# Patient Record
Sex: Male | Born: 1970
Health system: Southern US, Community
[De-identification: ages and names within clinical notes are randomized; demographics above are authoritative.]

## PROBLEM LIST (undated history)

## (undated) DIAGNOSIS — K219 Gastro-esophageal reflux disease without esophagitis: Secondary | ICD-10-CM

## (undated) DIAGNOSIS — E785 Hyperlipidemia, unspecified: Secondary | ICD-10-CM

## (undated) DIAGNOSIS — N343 Urethral syndrome, unspecified: Secondary | ICD-10-CM

## (undated) DIAGNOSIS — N201 Calculus of ureter: Secondary | ICD-10-CM

## (undated) DIAGNOSIS — I10 Essential (primary) hypertension: Secondary | ICD-10-CM

## (undated) DIAGNOSIS — N200 Calculus of kidney: Secondary | ICD-10-CM

## (undated) DIAGNOSIS — G4700 Insomnia, unspecified: Secondary | ICD-10-CM

## (undated) DIAGNOSIS — I2510 Atherosclerotic heart disease of native coronary artery without angina pectoris: Secondary | ICD-10-CM

## (undated) HISTORY — PX: TONSILLECTOMY: SUR1361

## (undated) HISTORY — PX: HERNIA REPAIR: SHX51

## (undated) HISTORY — DX: Gastro-esophageal reflux disease without esophagitis: K21.9

## (undated) HISTORY — DX: Hyperlipidemia, unspecified: E78.5

---

## 1999-12-12 ENCOUNTER — Encounter: Payer: Self-pay | Admitting: Orthopedic Surgery

## 1999-12-12 ENCOUNTER — Inpatient Hospital Stay (HOSPITAL_COMMUNITY): Admission: EM | Admit: 1999-12-12 | Discharge: 1999-12-18 | Payer: Self-pay | Admitting: Emergency Medicine

## 1999-12-13 ENCOUNTER — Encounter: Payer: Self-pay | Admitting: Orthopedic Surgery

## 2000-03-28 ENCOUNTER — Emergency Department (HOSPITAL_COMMUNITY): Admission: EM | Admit: 2000-03-28 | Discharge: 2000-03-28 | Payer: Self-pay | Admitting: Emergency Medicine

## 2002-11-13 ENCOUNTER — Emergency Department (HOSPITAL_COMMUNITY): Admission: EM | Admit: 2002-11-13 | Discharge: 2002-11-13 | Payer: Self-pay

## 2006-07-25 ENCOUNTER — Emergency Department (HOSPITAL_COMMUNITY): Admission: EM | Admit: 2006-07-25 | Discharge: 2006-07-25 | Payer: Self-pay | Admitting: Emergency Medicine

## 2007-02-24 ENCOUNTER — Ambulatory Visit (HOSPITAL_BASED_OUTPATIENT_CLINIC_OR_DEPARTMENT_OTHER): Admission: RE | Admit: 2007-02-24 | Discharge: 2007-02-24 | Payer: Self-pay | Admitting: Surgery

## 2008-12-24 ENCOUNTER — Emergency Department (HOSPITAL_COMMUNITY): Admission: EM | Admit: 2008-12-24 | Discharge: 2008-12-24 | Payer: Self-pay | Admitting: Emergency Medicine

## 2010-07-09 NOTE — Op Note (Signed)
NAME:  Russell Gregory, BORN NO.:  [id number]   MEDICAL RECORD NO.:  [id number]          PATIENT TYPE:  AMB   LOCATION:  DSC                          FACILITY:  [facility]   PHYSICIAN:  Currie Paris, M.D.DATE OF BIRTH:  October 29, 1970   DATE OF PROCEDURE:  02/24/2007  DATE OF DISCHARGE:                               OPERATIVE REPORT   OFFICE MEDICAL RECORD NUMBER CCS [phone]   PREOPERATIVE DIAGNOSIS:  Left inguinal hernia.   POSTOPERATIVE DIAGNOSIS:  Left inguinal hernia - direct.   OPERATION:  Left inguinal hernia repair with mesh.   SURGEON:  Currie Paris, M.D.   ANESTHESIA:  General (LMA).   CLINICAL HISTORY:  Mr. Winokur is a 41 year old gentleman with a left  inguinal hernia that he wished to have repaired.  He decided to proceed.   DESCRIPTION OF PROCEDURE:  The patient was seen in the holding area and  we confirmed that a left inguinal hernia was the operative plan.  We  both initialed the left side as the operative side.   The patient was taken to the operating room.  After satisfactory general  anesthesia had been obtained the left lower quadrant was clipped,  prepped and draped.  The time-out was done.   I used 0.25% plain Marcaine to see if we could help with postop pain  relief.  I infiltrated the incision line and below the fascia at the  anterior superior iliac spine.   Incision was made and deepened to the external oblique aponeurosis with  bleeders either coagulated or tied.  The aponeurosis was opened in line  of its fibers into the superficial ring.  It was elevated off the  underlying tissue and the cord structures dissected up off the inguinal  floor.  He was found to have a direct hernia with the preperitoneal and  attenuated tissue stuck to the undersurface of the cord and this was  dissected off and the hernia reduced.  I used some 2-0 Prolenes to  reapproximate the transversalis to keep the bulge reduced while we  prepared to place the  mesh.   I cleaned off the cord structures to make sure there was no indirect sac  and removed a little bit of preperitoneal fat that was coming through,  but there was no indirect sac that could be identified.  I thought we  had the deep ring snugged up nicely with suturesplaced medially.  I then  put an onlay graft of Marlex mesh cut to shape and split to go around  the cord.  This was sutured in with a running 2-0 Prolene starting at  the pubic tubercle and running along the lateral edge until we got  beyond the deep ring.  It was then tacked down medially with several  sutures of 2-0 Prolene.  The tails were crossed and tacked together.  The ilioinguinal nerve had been identified and was kept with the cord  structures as they came through the mesh.   Everything appeared to be dry.  I put more local in as we worked.  The  incision was closed with  3-0 Vicryl to reapproximate the external  oblique and Scarpa's and 4-0 Monocryl subcuticular plus Dermabond on the  skin.   The patient tolerated the procedure well and there no complications.  All counts were correct.      Currie Paris, M.D.  Electronically Signed     CJS/MEDQ  D:  02/24/2007  T:  02/24/2007  Job:  [id number]   cc:   Ernestina Penna, M.D.
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
## 2010-10-30 ENCOUNTER — Ambulatory Visit
Admission: RE | Admit: 2010-10-30 | Discharge: 2010-10-30 | Disposition: A | Discharge status: Home / Self Care | Payer: Self-pay | Source: Ambulatory Visit | Attending: Sports Medicine | Admitting: Sports Medicine

## 2010-10-30 ENCOUNTER — Other Ambulatory Visit: Payer: Self-pay | Admitting: Sports Medicine

## 2010-10-30 DIAGNOSIS — M533 Sacrococcygeal disorders, not elsewhere classified: Secondary | ICD-10-CM

## 2010-11-29 LAB — BASIC METABOLIC PANEL
CO2: 28
Calcium: 9.6
GFR calc Af Amer: >60
GFR calc non Af Amer: >60
Glucose, Bld: 101 — ABNORMAL HIGH
Potassium: 4.2
Sodium: 138

## 2010-11-29 LAB — POCT HEMOGLOBIN-HEMACUE: Hemoglobin: 14.7

## 2011-03-23 ENCOUNTER — Encounter (HOSPITAL_BASED_OUTPATIENT_CLINIC_OR_DEPARTMENT_OTHER): Payer: Self-pay | Admitting: *Deleted

## 2011-03-23 DIAGNOSIS — N200 Calculus of kidney: Secondary | ICD-10-CM | POA: Insufficient documentation

## 2011-03-23 DIAGNOSIS — I10 Essential (primary) hypertension: Secondary | ICD-10-CM | POA: Insufficient documentation

## 2011-03-23 DIAGNOSIS — R112 Nausea with vomiting, unspecified: Secondary | ICD-10-CM | POA: Insufficient documentation

## 2011-03-23 DIAGNOSIS — M549 Dorsalgia, unspecified: Secondary | ICD-10-CM | POA: Insufficient documentation

## 2011-03-23 DIAGNOSIS — R109 Unspecified abdominal pain: Secondary | ICD-10-CM | POA: Insufficient documentation

## 2011-03-23 NOTE — ED Notes (Signed)
Pt with right upper abd pain that radiates right upper back as well as N/V with PO intake. Pt denies fever or diarrhea

## 2011-03-24 ENCOUNTER — Ambulatory Visit (HOSPITAL_BASED_OUTPATIENT_CLINIC_OR_DEPARTMENT_OTHER)
Admit: 2011-03-24 | Discharge: 2011-03-24 | Disposition: A | Discharge status: Home / Self Care | Payer: BC Managed Care – PPO | Attending: Emergency Medicine | Admitting: Emergency Medicine

## 2011-03-24 ENCOUNTER — Emergency Department (HOSPITAL_BASED_OUTPATIENT_CLINIC_OR_DEPARTMENT_OTHER)
Admission: EM | Admit: 2011-03-24 | Discharge: 2011-03-24 | Disposition: A | Discharge status: Home / Self Care | Payer: BC Managed Care – PPO | Attending: Emergency Medicine | Admitting: Emergency Medicine

## 2011-03-24 ENCOUNTER — Emergency Department (INDEPENDENT_AMBULATORY_CARE_PROVIDER_SITE_OTHER): Payer: BC Managed Care – PPO

## 2011-03-24 DIAGNOSIS — R1011 Right upper quadrant pain: Secondary | ICD-10-CM

## 2011-03-24 DIAGNOSIS — N200 Calculus of kidney: Secondary | ICD-10-CM

## 2011-03-24 DIAGNOSIS — R112 Nausea with vomiting, unspecified: Secondary | ICD-10-CM

## 2011-03-24 DIAGNOSIS — R109 Unspecified abdominal pain: Secondary | ICD-10-CM

## 2011-03-24 HISTORY — DX: Calculus of kidney: N20.0

## 2011-03-24 HISTORY — DX: Essential (primary) hypertension: I10

## 2011-03-24 LAB — CBC
HCT: 45.1 % (ref 39.0–52.0)
MCHC: 35.3 g/dL (ref 30.0–36.0)
Platelets: 185 10*3/uL (ref 150–400)
RDW: 12.6 % (ref 11.5–15.5)
WBC: 6.1 10*3/uL (ref 4.0–10.5)

## 2011-03-24 LAB — HEPATIC FUNCTION PANEL
ALT: 34 U/L (ref 0–53)
AST: 28 U/L (ref 0–37)
Albumin: 3.9 g/dL (ref 3.5–5.2)
Bilirubin, Direct: 0.1 mg/dL (ref 0.0–0.3)
Total Protein: 7.6 g/dL (ref 6.0–8.3)

## 2011-03-24 LAB — BASIC METABOLIC PANEL
BUN: 9 mg/dL (ref 6–23)
Chloride: 103 mEq/L (ref 96–112)
GFR calc Af Amer: >90 mL/min (ref >90)
GFR calc non Af Amer: >90 mL/min (ref >90)
Potassium: 3.8 mEq/L (ref 3.5–5.1)
Sodium: 136 mEq/L (ref 135–145)

## 2011-03-24 LAB — URINALYSIS, ROUTINE W REFLEX MICROSCOPIC
Glucose, UA: NEGATIVE mg/dL
Ketones, ur: NEGATIVE mg/dL
Leukocytes, UA: NEGATIVE
Nitrite: NEGATIVE
Protein, ur: NEGATIVE mg/dL

## 2011-03-24 MED ORDER — SODIUM CHLORIDE 0.9 % IV BOLUS (SEPSIS)
1000.0000 mL | Freq: Once | INTRAVENOUS | Status: AC
  Administered 2011-03-24: 1000 mL via INTRAVENOUS

## 2011-03-24 MED ORDER — ONDANSETRON HCL 4 MG/2ML IJ SOLN
4.0000 mg | Freq: Once | INTRAMUSCULAR | Status: AC
  Administered 2011-03-24: 4 mg via INTRAVENOUS
  Filled 2011-03-24: qty 2

## 2011-03-24 MED ORDER — ONDANSETRON HCL 4 MG PO TABS: 4.0000 mg | ORAL_TABLET | Freq: Four times a day (QID) | ORAL | Status: AC

## 2011-03-24 NOTE — ED Notes (Signed)
MD at bedside. 

## 2011-03-24 NOTE — ED Provider Notes (Addendum)
History   This chart was scribed for Sunnie Nielsen, MD by Melba Coon. The patient was seen in room MH11/MH11 and the patient's care was started at 12:08AM.    CSN: [id number]  Arrival date & time 03/23/11  2352   First MD Initiated Contact with Patient 03/24/11 0003      Chief Complaint  Patient presents with  . Abdominal Pain  . Back Pain    (Consider location/radiation/quality/duration/timing/severity/associated sxs/prior treatment) HPI Russell Gregory is a 41 y.o. male who presents to the Emergency Department complaining of constant moderate to severe abdomninal pain with an onset 2-3 days ago. Associated back pain with an onset 2 weeks ago. Location right side abdomen. Sharp in quality. Radiates to the back. Abd pain hurts so much that pt can't sleep. Pt has had prior episode of kidney stones and says that it feels similar. Vomit and some indigestion present. No dysuria or problems urinating. No other pertinent medical problems. No Hx of abd surgereis. Take BP meds and Nexium. Return of pain tonight while at work and had to leave due to symptoms. No history of same. 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 Hypertension   . Kidney calculi     Past Surgical History  Procedure Date  . Tonsillectomy     History reviewed. No pertinent family history.  History  Substance Use Topics  . Smoking status: Never Smoker   . Smokeless tobacco: Not on file  . Alcohol Use: No      Review of Systems  Constitutional: Negative for fever and chills.  HENT: Negative for neck pain and neck stiffness.   Eyes: Negative for pain.  Respiratory: Negative for shortness of breath.   Cardiovascular: Negative for chest pain, palpitations and leg swelling.  Gastrointestinal: Positive for nausea, vomiting and abdominal pain.  Genitourinary: Negative for dysuria.  Musculoskeletal: Negative for back pain.  Skin:  Negative for rash.  Neurological: Negative for headaches.  All other systems reviewed and are negative.   10 Systems reviewed and are negative for acute change except as noted in the HPI.  Allergies  Morphine and related  Home Medications   Current Outpatient Rx  Name Route Sig Dispense Refill  . ESOMEPRAZOLE MAGNESIUM 40 MG PO CPDR Oral Take 40 mg by mouth daily before breakfast.    . TRANDOLAPRIL-VERAPAMIL HCL ER 2-240 MG PO TBCR Oral Take 1 tablet by mouth daily.      BP 121/77  Pulse 76  Temp(Src) 97.9 F (36.6 C) (Oral)  Resp 18  Ht 5\' 11"  (1.803 m)  Wt 180 lb (81.647 kg)  BMI 25.10 kg/m2  SpO2 100%  Physical Exam  Nursing note and vitals reviewed. Constitutional: He is oriented to person, place, and time. He appears well-developed and well-nourished.  HENT:  Head: Normocephalic and atraumatic.  Right Ear: External ear normal.  Left Ear: External ear normal.  Eyes: Conjunctivae and EOM are normal. Pupils are equal, round, and reactive to light. No scleral icterus.  Neck: Normal range of motion. Neck supple. No thyromegaly present.  Cardiovascular: Normal rate, regular rhythm and normal heart sounds.  Exam reveals no gallop and no friction rub.   No murmur heard. Pulmonary/Chest: Effort normal and breath sounds normal. No stridor. He has no wheezes. He has no rales. He exhibits no tenderness.  Abdominal: Soft. Bowel sounds are normal. He exhibits no distension and no mass.  There is no tenderness (right flank pain; no reproducible tednerness; standing up during exam due to discomfort). There is no rebound.  Musculoskeletal: Normal range of motion. He exhibits no edema. Tenderness: No CVA tenderness.  Lymphadenopathy:    He has no cervical adenopathy.  Neurological: He is alert and oriented to person, place, and time. Coordination normal.  Skin: Skin is warm. No rash noted. No erythema.  Psychiatric: He has a normal mood and affect. His behavior is normal.    ED  Course  Procedures (including critical care time)  DIAGNOSTIC STUDIES: Oxygen Saturation is 100% on room air, normal by my interpretation.    COORDINATION OF CARE: 12:12 AM - Pt decline any pain meds  Results for orders placed during the hospital encounter of 03/24/11  URINALYSIS, ROUTINE W REFLEX MICROSCOPIC      Component Value Range   Color, Urine YELLOW  YELLOW    APPearance CLEAR  CLEAR    Specific Gravity, Urine 1.025  1.005 - 1.030    pH 7.0  5.0 - 8.0    Glucose, UA NEGATIVE  NEGATIVE (mg/dL)   Hgb urine dipstick NEGATIVE  NEGATIVE    Bilirubin Urine NEGATIVE  NEGATIVE    Ketones, ur NEGATIVE  NEGATIVE (mg/dL)   Protein, ur NEGATIVE  NEGATIVE (mg/dL)   Urobilinogen, UA 0.2  0.0 - 1.0 (mg/dL)   Nitrite NEGATIVE  NEGATIVE    Leukocytes, UA NEGATIVE  NEGATIVE   CBC      Component Value Range   WBC 6.1  4.0 - 10.5 (K/uL)   RBC 4.94  4.22 - 5.81 (MIL/uL)   Hemoglobin 15.9  13.0 - 17.0 (g/dL)   HCT 96.0  45.4 - 09.8 (%)   MCV 91.3  78.0 - 100.0 (fL)   MCH 32.2  26.0 - 34.0 (pg)   MCHC 35.3  30.0 - 36.0 (g/dL)   RDW 11.9  14.7 - 82.9 (%)   Platelets 185  150 - 400 (K/uL)  BASIC METABOLIC PANEL      Component Value Range   Sodium 136  135 - 145 (mEq/L)   Potassium 3.8  3.5 - 5.1 (mEq/L)   Chloride 103  96 - 112 (mEq/L)   CO2 24  19 - 32 (mEq/L)   Glucose, Bld 132 (*) 70 - 99 (mg/dL)   BUN 9  6 - 23 (mg/dL)   Creatinine, Ser 5.62  0.50 - 1.35 (mg/dL)   Calcium 9.0  8.4 - 13.0 (mg/dL)   GFR calc non Af Amer >90  >90 (mL/min)   GFR calc Af Amer >90  >90 (mL/min)  HEPATIC FUNCTION PANEL      Component Value Range   Total Protein 7.6  6.0 - 8.3 (g/dL)   Albumin 3.9  3.5 - 5.2 (g/dL)   AST 28  0 - 37 (U/L)   ALT 34  0 - 53 (U/L)   Alkaline Phosphatase 79  39 - 117 (U/L)   Total Bilirubin 0.3  0.3 - 1.2 (mg/dL)   Bilirubin, Direct 0.1  0.0 - 0.3 (mg/dL)   Indirect Bilirubin 0.2 (*) 0.3 - 0.9 (mg/dL)   Ct Abdomen Pelvis Wo Contrast  03/24/2011  *RADIOLOGY REPORT*   Clinical Data: Right flank pain, nausea, and vomiting  CT ABDOMEN AND PELVIS WITHOUT CONTRAST  Technique:  Multidetector CT imaging of the abdomen and pelvis was performed following the standard protocol without intravenous contrast.  Comparison: None available.  Findings: The lung bases are clear.  3 mm stone in  the lower pole of the right kidney without obstruction.  Punctate stone in the lower pole of the left kidney without obstruction.  No pyelocaliectasis or ureterectasis.  No ureteral stones or bladder stones.  The unenhanced appearance of the liver, spleen, gallbladder, pancreas, adrenal glands, abdominal aorta, and retroperitoneal lymph nodes is unremarkable.  The stomach and small bowel are decompressed.  Stool filled colon without wall thickening or distension.  No free air or free fluid in the abdomen.  Pelvis:  The bladder is decompressed.  The prostate is not significantly enlarged.  No free or loculated pelvic fluid collections.  The appendix is normal.  No inflammatory changes in the sigmoid colon.  Normal alignment of the lumbar vertebrae.  IMPRESSION: Bilateral nonobstructive intrarenal stones.  No ureteral stone or obstruction.  Original Report Authenticated By: Marlon Pel, M.D.    Serial rechecks. No peritonitis or reproducible tenderness. No vomiting in ER. UA, labs and CT scan reviewed as above. No obvious ureterolithiasis. Plan ultrasound. No ultrasound tech available tonight so will schedule for first thing in the morning. Based on improving condition, do not feel patient needs to be transferred to another facility tonight for emergent ultrasound.  He is agreeable to this plan.    MDM  Right-sided abdominal pain. Possible ureteral lithiasis versus gallbladder disease. No findings of kidney stone on CAT scan and no hematuria. No leukocytosis or fever. Pain improved and patient declines any pain medications in the ER.   Zofran and IV fluids provided  Plan: Return in a.m. for  ultrasound. If no gallbladder disease is found, will refer to GI as needed for any persistent symptoms. Reliable historian and agrees to be evaluated immediately for any fevers, severe pain or worsening condition.   I personally performed the services described in this documentation, which was scribed in my presence. The recorded information has been reviewed and considered.        Sunnie Nielsen, MD 03/24/11 [id number]  Sunnie Nielsen, MD 03/24/11 [phone]

## 2011-03-25 ENCOUNTER — Encounter: Payer: Self-pay | Admitting: Gastroenterology

## 2011-04-03 ENCOUNTER — Ambulatory Visit: Payer: BC Managed Care – PPO | Admitting: Gastroenterology

## 2012-05-19 ENCOUNTER — Other Ambulatory Visit: Payer: Self-pay | Admitting: *Deleted

## 2012-05-20 ENCOUNTER — Telehealth: Payer: Self-pay | Admitting: Family Medicine

## 2012-05-20 MED ORDER — ZOLPIDEM TARTRATE 10 MG PO TABS: 10.0000 mg | ORAL_TABLET | Freq: Every evening | ORAL | Status: DC | PRN

## 2012-05-20 NOTE — Telephone Encounter (Signed)
Patient called to get a refill on his Ambien 10mg  once daily.

## 2012-05-21 NOTE — Telephone Encounter (Signed)
Called into [facility]

## 2012-06-17 ENCOUNTER — Other Ambulatory Visit: Payer: Self-pay | Admitting: Family Medicine

## 2012-06-17 NOTE — Telephone Encounter (Signed)
Pt has an appt. With you on 07/23/12 for med check up, last seen by [facility] on 02/23/13. Last filled 05/21/12 by [facility]. If approved have nurse call into [facility]

## 2012-06-21 ENCOUNTER — Telehealth: Payer: Self-pay | Admitting: Nurse Practitioner

## 2012-06-21 NOTE — Telephone Encounter (Signed)
Was called in by Earna Coder at 12:02 PM today

## 2012-06-21 NOTE — Telephone Encounter (Signed)
Phoned into [facility] pharmacy and left on their voicemail.

## 2012-06-24 ENCOUNTER — Telehealth: Payer: Self-pay

## 2012-06-24 NOTE — Telephone Encounter (Signed)
Left message to return call with refills needed appt Jul 23 2012

## 2012-07-09 ENCOUNTER — Other Ambulatory Visit: Payer: Self-pay | Admitting: *Deleted

## 2012-07-09 MED ORDER — ATORVASTATIN CALCIUM 40 MG PO TABS: 40.0000 mg | ORAL_TABLET | Freq: Every day | ORAL | Status: DC

## 2012-07-23 ENCOUNTER — Encounter: Payer: Self-pay | Admitting: Family Medicine

## 2012-07-23 ENCOUNTER — Ambulatory Visit (INDEPENDENT_AMBULATORY_CARE_PROVIDER_SITE_OTHER): Payer: BC Managed Care – PPO | Admitting: Family Medicine

## 2012-07-23 VITALS — BP 134/85 | HR 55 | Temp 98.1°F | Ht 71.0 in | Wt 187.6 lb

## 2012-07-23 DIAGNOSIS — K219 Gastro-esophageal reflux disease without esophagitis: Secondary | ICD-10-CM | POA: Insufficient documentation

## 2012-07-23 DIAGNOSIS — G4700 Insomnia, unspecified: Secondary | ICD-10-CM | POA: Insufficient documentation

## 2012-07-23 DIAGNOSIS — E785 Hyperlipidemia, unspecified: Secondary | ICD-10-CM

## 2012-07-23 DIAGNOSIS — I10 Essential (primary) hypertension: Secondary | ICD-10-CM

## 2012-07-23 HISTORY — DX: Hyperlipidemia, unspecified: E78.5

## 2012-07-23 LAB — COMPLETE METABOLIC PANEL WITH GFR
ALT: 25 U/L (ref 0–53)
AST: 21 U/L (ref 0–37)
Albumin: 4.9 g/dL (ref 3.5–5.2)
Alkaline Phosphatase: 66 U/L (ref 39–117)
BUN: 15 mg/dL (ref 6–23)
CO2: 26 mEq/L (ref 19–32)
Calcium: 9.5 mg/dL (ref 8.4–10.5)
Chloride: 102 mEq/L (ref 96–112)
Creat: 1.03 mg/dL (ref 0.50–1.35)
GFR, Est African American: >89 mL/min
GFR, Est Non African American: 89 mL/min
Glucose, Bld: 86 mg/dL (ref 70–99)
Potassium: 4.1 mEq/L (ref 3.5–5.3)
Sodium: 138 mEq/L (ref 135–145)
Total Bilirubin: 0.7 mg/dL (ref 0.3–1.2)
Total Protein: 7.3 g/dL (ref 6.0–8.3)

## 2012-07-23 MED ORDER — ATORVASTATIN CALCIUM 40 MG PO TABS: 40.0000 mg | ORAL_TABLET | Freq: Every day | ORAL | Status: DC

## 2012-07-23 MED ORDER — ESOMEPRAZOLE MAGNESIUM 40 MG PO CPDR: 40.0000 mg | DELAYED_RELEASE_CAPSULE | Freq: Every day | ORAL | Status: DC

## 2012-07-23 MED ORDER — ZOLPIDEM TARTRATE 10 MG PO TABS: ORAL_TABLET | ORAL | Status: DC

## 2012-07-23 NOTE — Progress Notes (Signed)
Patient ID: Russell Gregory, male   DOB: Feb 01, 1971, 42 y.o.   MRN: [id number] SUBJECTIVE: HPI: Patient is here for follow up of hypertension:  denies Headache;deniesChest Pain;denies weakness;denies Shortness of Breath or Orthopnea;denies Visual changes;denies palpitations;denies cough;denies pedal edema;denies symptoms of TIA or stroke; admits to Compliance with medications. denies Problems with medications.  INsomnia: Works  Swing shifts for years and for the last 7 years has used [place] to regulate sleep.  Patient is here for follow up of hyperlipidemia:  denies Headache;denies Chest Pain;denies weakness;denies Shortness of Breath and orthopnea;denies Visual changes;denies palpitations;denies cough;denies pedal edema;denies symptoms of TIA or stroke;deniesClaudication symptoms. admits to Compliance with medications; denies Problems with medications.   PMH/PSH: reviewed/updated in Epic  SH/FH: reviewed/updated in Epic, wife works for [employer] and is trying to [employer] healthy  Allergies: reviewed/updated in [employer]  Medications: reviewed/updated in [employer]  Immunizations: reviewed/updated in Epic  ROS: As above in the HPI. All other systems are stable or negative.  OBJECTIVE: APPEARANCE:  Patient in no acute distress.The patient appeared well nourished and normally developed. Acyanotic. Waist: VITAL SIGNS:BP 134/85  Pulse 55  Temp(Src) 98.1 F (36.7 C) (Oral)  Ht 5\' 11"  (1.803 m)  Wt 187 lb 9.6 oz (85.095 kg)  BMI 26.18 kg/m2   SKIN: warm and  Dry without overt rashes, tattoos and scars  HEAD and Neck: without JVD, Head and scalp: normal Eyes:No scleral icterus. Fundi normal, eye movements normal. Ears: Auricle normal, canal normal, Tympanic membranes normal, insufflation normal. Nose: normal Throat: normal Neck & thyroid: normal  CHEST & LUNGS: Chest wall: normal Lungs: Clear  CVS: Reveals the PMI to be normally located. Regular rhythm, First and Second  Heart sounds are normal,  absence of murmurs, rubs or gallops. Peripheral vasculature: Radial pulses: normal Dorsal pedis pulses: normal Posterior pulses: normal  ABDOMEN:  Appearance: normal Benign,, no organomegaly, no masses, no Abdominal Aortic enlargement. No Guarding , no rebound. No Bruits. Bowel sounds: normal  RECTAL: N/A GU: N/A  EXTREMETIES: nonedematous. Both Femoral and Pedal pulses are normal.  MUSCULOSKELETAL:  Spine: normal Joints: intact  NEUROLOGIC: oriented to time,place and person; nonfocal. Strength is normal Sensory is normal Reflexes are normal Cranial Nerves are normal.  ASSESSMENT: HTN (hypertension) - Plan: COMPLETE METABOLIC PANEL WITH GFR  HLD (hyperlipidemia) - Plan: atorvastatin (LIPITOR) 40 MG tablet, COMPLETE METABOLIC PANEL WITH GFR, NMR Lipoprofile with Lipids  Insomnia - Plan: zolpidem (AMBIEN) 10 MG tablet  GERD (gastroesophageal reflux disease) - Plan: esomeprazole (NEXIUM) 40 MG capsule  PLAN: Orders Placed This Encounter  Procedures  . COMPLETE METABOLIC PANEL WITH GFR  . NMR Lipoprofile with Lipids   Meds ordered this encounter  Medications  . esomeprazole (NEXIUM) 40 MG capsule    Sig: Take 1 capsule (40 mg total) by mouth daily before breakfast.    Dispense:  30 capsule    Refill:  0  . zolpidem (AMBIEN) 10 MG tablet    Sig: TAKE 1 TABLET DAILY AT BEDTIME AS NEEDED    Dispense:  30 tablet    Refill:  1  . atorvastatin (LIPITOR) 40 MG tablet    Sig: Take 1 tablet (40 mg total) by mouth daily.    Dispense:  90 tablet    Refill:  2        Dr Woodroe Mode Recommendations  Diet and Exercise discussed with patient.  For nutrition information, I recommend books:  1).Eat to Live by Dr Monico Hoar. 2).Prevent and Reverse Heart Disease by Dr  Suzzette Righter. 3) Dr Katherina Right Book: Reversing Diabetes  Exercise recommendations are:  If unable to walk, then the patient can exercise in a chair 3 times a day.  By flapping arms like a bird gently and raising legs outwards to the front.  If ambulatory, the patient can go for walks for 30 minutes 3 times a week. Then increase the intensity and duration as tolerated.  Goal is to try to attain exercise frequency to 5 times a week.  If applicable: Best to perform resistance exercises (machines or weights) 2 days a week and cardio type exercises 3 days per week.  Return in about 3 months (around 10/23/2012) for Recheck medical problems.  Hasan Douse P. Modesto Charon, M.D.

## 2012-07-23 NOTE — Patient Instructions (Addendum)
      Dr Shalita Notte's Recommendations  Diet and Exercise discussed with patient.  For nutrition information, I recommend books:  1).Eat to Live by Dr Joel Fuhrman. 2).Prevent and Reverse Heart Disease by Dr Caldwell Esselstyn. 3) Dr Neal Barnard's Book: Reversing Diabetes  Exercise recommendations are:  If unable to walk, then the patient can exercise in a chair 3 times a day. By flapping arms like a bird gently and raising legs outwards to the front.  If ambulatory, the patient can go for walks for 30 minutes 3 times a week. Then increase the intensity and duration as tolerated.  Goal is to try to attain exercise frequency to 5 times a week.  If applicable: Best to perform resistance exercises (machines or weights) 2 days a week and cardio type exercises 3 days per week.  

## 2012-07-27 LAB — NMR LIPOPROFILE WITH LIPIDS
Cholesterol, Total: 151 mg/dL (ref <200)
HDL Particle Number: 33.7 umol/L (ref >=30.5)
HDL Size: 8.5 nm — ABNORMAL LOW (ref >=9.2)
HDL-C: 40 mg/dL (ref >=40)
LDL (calc): 86 mg/dL (ref <100)
LDL Particle Number: 1365 nmol/L — ABNORMAL HIGH (ref <1000)
LDL Size: 20.1 nm — ABNORMAL LOW (ref >20.5)
LP-IR Score: 70 — ABNORMAL HIGH (ref <=45)
Large HDL-P: 1.4 umol/L — ABNORMAL LOW (ref >=4.8)
Large VLDL-P: 2.3 nmol/L (ref <=2.7)
Small LDL Particle Number: 940 nmol/L — ABNORMAL HIGH (ref <=527)
Triglycerides: 126 mg/dL (ref <150)
VLDL Size: 51.4 nm — ABNORMAL HIGH (ref <=46.6)

## 2012-07-28 NOTE — Progress Notes (Signed)
Quick Note:  Lab result close to goal. No change in Medications for now. Aggressive dietary changes. Read the book "Eat to Live" by Dr Ottis Stain. No Change in plans and follow up. ______

## 2012-08-02 ENCOUNTER — Telehealth: Payer: Self-pay | Admitting: Family Medicine

## 2012-08-03 ENCOUNTER — Other Ambulatory Visit: Payer: Self-pay | Admitting: Nurse Practitioner

## 2012-09-13 ENCOUNTER — Other Ambulatory Visit: Payer: Self-pay | Admitting: Family Medicine

## 2012-09-15 NOTE — Telephone Encounter (Signed)
Med called

## 2012-09-15 NOTE — Telephone Encounter (Signed)
LAST RF 08/17/12. LAST OV 07/23/12. IF APPROVED CALL [facility].

## 2012-09-15 NOTE — Telephone Encounter (Signed)
Call in please

## 2012-10-12 ENCOUNTER — Other Ambulatory Visit: Payer: Self-pay | Admitting: Family Medicine

## 2012-10-13 NOTE — Telephone Encounter (Signed)
Patient last office visit on 07-23-12. Please advise. If approved please route to Pool A so nurse can call in to pharmacy

## 2012-10-14 ENCOUNTER — Encounter: Payer: Self-pay | Admitting: *Deleted

## 2012-10-14 NOTE — Telephone Encounter (Signed)
Rx ready for Phone in. 

## 2012-10-15 NOTE — Telephone Encounter (Signed)
rx for zolpidem called to [facility]

## 2012-10-27 ENCOUNTER — Ambulatory Visit: Payer: BC Managed Care – PPO | Admitting: Family Medicine

## 2012-10-29 ENCOUNTER — Encounter: Payer: Self-pay | Admitting: Family Medicine

## 2012-10-29 ENCOUNTER — Ambulatory Visit (INDEPENDENT_AMBULATORY_CARE_PROVIDER_SITE_OTHER): Payer: BC Managed Care – PPO | Admitting: Family Medicine

## 2012-10-29 VITALS — BP 135/84 | HR 49 | Temp 97.6°F | Ht 72.0 in | Wt 187.8 lb

## 2012-10-29 DIAGNOSIS — K219 Gastro-esophageal reflux disease without esophagitis: Secondary | ICD-10-CM

## 2012-10-29 DIAGNOSIS — N200 Calculus of kidney: Secondary | ICD-10-CM | POA: Insufficient documentation

## 2012-10-29 DIAGNOSIS — E785 Hyperlipidemia, unspecified: Secondary | ICD-10-CM

## 2012-10-29 DIAGNOSIS — Z23 Encounter for immunization: Secondary | ICD-10-CM

## 2012-10-29 DIAGNOSIS — I10 Essential (primary) hypertension: Secondary | ICD-10-CM

## 2012-10-29 DIAGNOSIS — G4700 Insomnia, unspecified: Secondary | ICD-10-CM

## 2012-10-29 MED ORDER — ZOLPIDEM TARTRATE 10 MG PO TABS: ORAL_TABLET | ORAL | Status: DC

## 2012-10-29 NOTE — Patient Instructions (Addendum)

## 2012-10-29 NOTE — Progress Notes (Signed)
Tolerated tdap injection without difficulty 

## 2012-10-29 NOTE — Progress Notes (Signed)
Patient ID: Russell Gregory, male   DOB: 06-May-1970, 42 y.o.   MRN: [id number] SUBJECTIVE: CC: Chief Complaint  Patient presents with  . Follow-up    3 month follow up no complaints    HPI: Patient is here for follow up of hyperlipidemia: denies Headache;denies Chest Pain;denies weakness;denies Shortness of Breath and orthopnea;denies Visual changes;denies palpitations;denies cough;denies pedal edema;denies symptoms of TIA or stroke;deniesClaudication symptoms. admits to Compliance with medications; denies Problems with medications.  Changed his diet a bit. His wife is trying to cook from the recipes in "Eat To Live"  Works  Swing shift.has problem with insomnia.  Past Medical History  Diagnosis Date  . Inguinal hernia   . Hyperlipidemia   . Hypertension   . GERD (gastroesophageal reflux disease)    Past Surgical History  Procedure Laterality Date  . Tonsillectomy    . Hernia repair      left inguinal repair   History   Social History  . Marital Status: Married    Spouse Name: N/A    Number of Children: N/A  . Years of Education: N/A   Occupational History  . Not on file.   Social History Main Topics  . Smoking status: Never Smoker   . Smokeless tobacco: Not on file  . Alcohol Use: No  . Drug Use: No  . Sexual Activity:    Other Topics Concern  . Not on file   Social History Narrative  . No narrative on file   No family history on file. Current Outpatient Prescriptions on File Prior to Visit  Medication Sig Dispense Refill  . atorvastatin (LIPITOR) 40 MG tablet Take 1 tablet (40 mg total) by mouth daily.  90 tablet  2  . esomeprazole (NEXIUM) 40 MG capsule Take 1 capsule (40 mg total) by mouth daily before breakfast.  30 capsule  0  . TARKA 4-240 MG per tablet TAKE 1 TABLET EVERY DAY  90 tablet  1  . zolpidem (AMBIEN) 10 MG tablet TAKE 1 TABLET AT BEDTIME  30 tablet  0   No current facility-administered medications on file prior to visit.   Allergies   Allergen Reactions  . Morphine And Related    Immunization History  Administered Date(s) Administered  . Tdap 10/29/2012   Prior to Admission medications   Medication Sig Start Date End Date Taking? Authorizing Provider  atorvastatin (LIPITOR) 40 MG tablet Take 1 tablet (40 mg total) by mouth daily. 07/23/12  Yes Ileana Ladd, MD  esomeprazole (NEXIUM) 40 MG capsule Take 1 capsule (40 mg total) by mouth daily before breakfast. 07/23/12  Yes Ileana Ladd, MD  TARKA 4-240 MG per tablet TAKE 1 TABLET EVERY DAY 08/03/12  Yes Ileana Ladd, MD  zolpidem (AMBIEN) 10 MG tablet TAKE 1 TABLET AT BEDTIME 10/12/12  Yes Ileana Ladd, MD     ROS: As above in the HPI. All other systems are stable or negative.  OBJECTIVE: APPEARANCE:  Patient in no acute distress.The patient appeared well nourished and normally developed. Acyanotic. Waist: VITAL SIGNS:BP 135/84  Pulse 49  Temp(Src) 97.6 F (36.4 C) (Oral)  Ht 6' (1.829 m)  Wt 187 lb 12.8 oz (85.186 kg)  BMI 25.46 kg/m2 WM  SKIN: warm and  Dry without overt rashes, tattoos and scars  HEAD and Neck: without JVD, Head and scalp: normal Eyes:No scleral icterus. Fundi normal, eye movements normal. Ears: Auricle normal, canal normal, Tympanic membranes normal, insufflation normal. Nose: normal Throat: normal Neck &  thyroid: normal  CHEST & LUNGS: Chest wall: normal Lungs: Clear  CVS: Reveals the PMI to be normally located. Regular rhythm, First and Second Heart sounds are normal,  absence of murmurs, rubs or gallops. Peripheral vasculature: Radial pulses: normal Dorsal pedis pulses: normal Posterior pulses: normal  ABDOMEN:  Appearance: normal Benign, no organomegaly, no masses, no Abdominal Aortic enlargement. No Guarding , no rebound. No Bruits. Bowel sounds: normal  RECTAL: N/A GU: N/A  EXTREMETIES: nonedematous.  MUSCULOSKELETAL:  Spine: normal Joints: intact  NEUROLOGIC: oriented to time,place and person;  nonfocal. Strength is normal Sensory is normal Reflexes are normal Cranial Nerves are normal.  Results for orders placed in visit on 07/23/12  COMPLETE METABOLIC PANEL WITH GFR      Result Value Range   Sodium 138  135 - 145 mEq/L   Potassium 4.1  3.5 - 5.3 mEq/L   Chloride 102  96 - 112 mEq/L   CO2 26  19 - 32 mEq/L   Glucose, Bld 86  70 - 99 mg/dL   BUN 15  6 - 23 mg/dL   Creat 4.40  3.47 - 4.25 mg/dL   Total Bilirubin 0.7  0.3 - 1.2 mg/dL   Alkaline Phosphatase 66  39 - 117 U/L   AST 21  0 - 37 U/L   ALT 25  0 - 53 U/L   Total Protein 7.3  6.0 - 8.3 g/dL   Albumin 4.9  3.5 - 5.2 g/dL   Calcium 9.5  8.4 - 95.6 mg/dL   GFR, Est African American >89     GFR, Est Non African American 89    NMR LIPOPROFILE WITH LIPIDS      Result Value Range   LDL Particle Number [id number] (*) <1000 nmol/L   LDL (calc) 86  <100 mg/dL   HDL-C 40  >=38 mg/dL   Triglycerides 756  <433 mg/dL   Cholesterol, Total 295  <200 mg/dL   HDL Particle Number 18.8  >=41.6 umol/L   Large HDL-P 1.4 (*) >=4.8 umol/L   Large VLDL-P 2.3  <=2.7 nmol/L   Small LDL Particle Number [id number] (*) <=527 nmol/L   LDL Size 20.1 (*) >20.5 nm   HDL Size 8.5 (*) >=9.2 nm   VLDL Size 51.4 (*) <=46.6 nm   LP-IR Score 70 (*) <=45    ASSESSMENT: Need for Tdap vaccination - Plan: Tdap vaccine greater than or equal to 7yo IM  GERD (gastroesophageal reflux disease)  HLD (hyperlipidemia) - Plan: CMP14+EGFR, NMR, lipoprofile  HTN (hypertension) - Plan: CMP14+EGFR  Insomnia - Plan: zolpidem (AMBIEN) 10 MG tablet  PLAN:   Orders Placed This Encounter  Procedures  . Tdap vaccine greater than or equal to 7yo IM  . CMP14+EGFR  . NMR, lipoprofile    Meds ordered this encounter  Medications  . zolpidem (AMBIEN) 10 MG tablet    Sig: TAKE 1 TABLET AT BEDTIME    Dispense:  30 tablet    Refill:  1    Discuss a healthy plant based diet and lifestyle.  Return in about 4 months (around 02/28/2013) for Recheck medical  problems.  Laurissa Cowper P. [place] Charon, M.D.

## 2012-10-31 LAB — NMR, LIPOPROFILE
Cholesterol: 169 mg/dL (ref <200)
HDL Cholesterol by NMR: 44 mg/dL (ref >=40)
HDL Particle Number: 36.4 umol/L (ref >=30.5)
LDL Particle Number: 1658 nmol/L — ABNORMAL HIGH (ref <1000)
LDL Size: 20.6 nm (ref >20.5)
LDLC SERPL CALC-MCNC: 102 mg/dL — ABNORMAL HIGH (ref <100)
LP-IR Score: 58 — ABNORMAL HIGH (ref <=45)
Small LDL Particle Number: 1020 nmol/L — ABNORMAL HIGH (ref <=527)
Triglycerides by NMR: 114 mg/dL (ref <150)

## 2012-10-31 LAB — CMP14+EGFR
ALT: 25 IU/L (ref 0–44)
AST: 23 IU/L (ref 0–40)
Albumin/Globulin Ratio: 1.8 (ref 1.1–2.5)
Albumin: 4.6 g/dL (ref 3.5–5.5)
Alkaline Phosphatase: 65 IU/L (ref 39–117)
BUN/Creatinine Ratio: 17 (ref 9–20)
BUN: 18 mg/dL (ref 6–24)
CO2: 21 mmol/L (ref 18–29)
Calcium: 9.7 mg/dL (ref 8.7–10.2)
Chloride: 98 mmol/L (ref 97–108)
Creatinine, Ser: 1.03 mg/dL (ref 0.76–1.27)
GFR calc Af Amer: 103 mL/min/{1.73_m2} (ref >59)
GFR calc non Af Amer: 89 mL/min/{1.73_m2} (ref >59)
Globulin, Total: 2.6 g/dL (ref 1.5–4.5)
Glucose: 83 mg/dL (ref 65–99)
Potassium: 4.2 mmol/L (ref 3.5–5.2)
Sodium: 138 mmol/L (ref 134–144)
Total Bilirubin: 0.5 mg/dL (ref 0.0–1.2)
Total Protein: 7.2 g/dL (ref 6.0–8.5)

## 2012-11-03 ENCOUNTER — Other Ambulatory Visit: Payer: Self-pay | Admitting: Family Medicine

## 2012-11-03 MED ORDER — EZETIMIBE 10 MG PO TABS: 10.0000 mg | ORAL_TABLET | Freq: Every day | ORAL | Status: DC

## 2013-01-12 ENCOUNTER — Other Ambulatory Visit: Payer: Self-pay | Admitting: Family Medicine

## 2013-01-12 NOTE — Telephone Encounter (Signed)
If approved route [facility] Last filled 12/15/12.

## 2013-01-12 NOTE — Telephone Encounter (Signed)
[facility]

## 2013-01-12 NOTE — Telephone Encounter (Signed)
This is okay as written

## 2013-02-01 ENCOUNTER — Other Ambulatory Visit: Payer: Self-pay | Admitting: Family Medicine

## 2013-03-01 ENCOUNTER — Encounter: Payer: Self-pay | Admitting: Family Medicine

## 2013-03-01 ENCOUNTER — Ambulatory Visit (INDEPENDENT_AMBULATORY_CARE_PROVIDER_SITE_OTHER): Payer: BC Managed Care – PPO | Admitting: Family Medicine

## 2013-03-01 VITALS — BP 125/82 | HR 55 | Temp 97.4°F | Ht 72.0 in | Wt 188.6 lb

## 2013-03-01 DIAGNOSIS — E785 Hyperlipidemia, unspecified: Secondary | ICD-10-CM

## 2013-03-01 DIAGNOSIS — G4700 Insomnia, unspecified: Secondary | ICD-10-CM

## 2013-03-01 DIAGNOSIS — I10 Essential (primary) hypertension: Secondary | ICD-10-CM

## 2013-03-01 DIAGNOSIS — K219 Gastro-esophageal reflux disease without esophagitis: Secondary | ICD-10-CM

## 2013-03-01 MED ORDER — ZOLPIDEM TARTRATE 10 MG PO TABS: ORAL_TABLET | ORAL | Status: DC

## 2013-03-01 MED ORDER — EZETIMIBE-SIMVASTATIN 10-20 MG PO TABS: 1.0000 | ORAL_TABLET | Freq: Every day | ORAL | Status: DC

## 2013-03-01 MED ORDER — TRANDOLAPRIL-VERAPAMIL HCL ER 4-240 MG PO TBCR: EXTENDED_RELEASE_TABLET | ORAL | Status: DC

## 2013-03-01 NOTE — Patient Instructions (Signed)
      Dr Surah Pelley's Recommendations  For nutrition information, I recommend books:  1).Eat to Live by Dr Joel Fuhrman. 2).Prevent and Reverse Heart Disease by Dr Caldwell Esselstyn. 3) Dr Neal Barnard's Book:  Program to Reverse Diabetes  Exercise recommendations are:  If unable to walk, then the patient can exercise in a chair 3 times a day. By flapping arms like a bird gently and raising legs outwards to the front.  If ambulatory, the patient can go for walks for 30 minutes 3 times a week. Then increase the intensity and duration as tolerated.  Goal is to try to attain exercise frequency to 5 times a week.  If applicable: Best to perform resistance exercises (machines or weights) 2 days a week and cardio type exercises 3 days per week.  

## 2013-03-01 NOTE — Progress Notes (Signed)
Patient ID: Russell Gregory, male   DOB: [birth year], 43 y.o.   MRN: [id number] SUBJECTIVE: CC: Chief Complaint  Patient presents with  . Follow-up    4 month legs continining to ache wants different med  refill ambien and tarka    HPI: Patient is here for follow up of hyperlipidemia/HTN: denies Headache;denies Chest Pain;denies weakness;denies Shortness of Breath and orthopnea;denies Visual changes;denies palpitations;denies cough;denies pedal edema;denies symptoms of TIA or stroke;deniesClaudication symptoms. admits to Compliance with medications; Problems with medications.legs hurt intermittently and he attributes it to the atorvastatin and worse with the zetia  Added. He would like to try the vytorin.his wife uses without problems.   Past Medical History  Diagnosis Date  . Inguinal hernia   . Hyperlipidemia   . Hypertension   . GERD (gastroesophageal reflux disease)    Past Surgical History  Procedure Laterality Date  . Tonsillectomy    . Hernia repair      left inguinal repair   History   Social History  . Marital Status: Married    Spouse Name: N/A    Number of Children: N/A  . Years of Education: N/A   Occupational History  . Not on file.   Social History Main Topics  . Smoking status: Never Smoker   . Smokeless tobacco: Not on file  . Alcohol Use: No  . Drug Use: No  . Sexual Activity:    Other Topics Concern  . Not on file   Social History Narrative  . No narrative on file   No family history on file. Current Outpatient Prescriptions on File Prior to Visit  Medication Sig Dispense Refill  . esomeprazole (NEXIUM) 40 MG capsule Take 1 capsule (40 mg total) by mouth daily before breakfast.  30 capsule  0   No current facility-administered medications on file prior to visit.   Allergies  Allergen Reactions  . Morphine And Related    Immunization History  Administered Date(s) Administered  . Influenza-Unspecified 12/30/2012  . Tdap 10/29/2012    Prior to Admission medications   Medication Sig Start Date End Date Taking? Authorizing Provider  atorvastatin (LIPITOR) 40 MG tablet Take 1 tablet (40 mg total) by mouth daily. 07/23/12  Yes Vernie Shanks, MD  ezetimibe (ZETIA) 10 MG tablet Take 1 tablet (10 mg total) by mouth daily. 11/03/12  Yes Vernie Shanks, MD  TARKA 4-240 MG per tablet TAKE 1 TABLET EVERY DAY 02/01/13  Yes Vernie Shanks, MD  zolpidem (AMBIEN) 10 MG tablet TAKE 1 TABLET AT BEDTIME 01/12/13  Yes Chipper Herb, MD  esomeprazole (NEXIUM) 40 MG capsule Take 1 capsule (40 mg total) by mouth daily before breakfast. 07/23/12   Vernie Shanks, MD     ROS: As above in the HPI. All other systems are stable or negative.  OBJECTIVE: APPEARANCE:  Patient in no acute distress.The patient appeared well nourished and normally developed. Acyanotic. Waist: VITAL SIGNS:BP 125/82  Pulse 55  Temp(Src) 97.4 F (36.3 C) (Oral)  Ht 6' (1.829 m)  Wt 188 lb 9.6 oz (85.548 kg)  BMI 25.57 kg/m2 WM  SKIN: warm and  Dry without overt rashes, tattoos and scars  HEAD and Neck: without JVD, Head and scalp: normal Eyes:No scleral icterus. Fundi normal, eye movements normal. Ears: Auricle normal, canal normal, Tympanic membranes normal, insufflation normal. Nose: normal Throat: normal Neck & thyroid: normal  CHEST & LUNGS: Chest wall: normal Lungs: Clear  CVS: Reveals the PMI to be normally located. Regular  rhythm, First and Second Heart sounds are normal,  absence of murmurs, rubs or gallops. Peripheral vasculature: Radial pulses: normal Dorsal pedis pulses: normal Posterior pulses: normal  ABDOMEN:  Appearance: normal Benign, no organomegaly, no masses, no Abdominal Aortic enlargement. No Guarding , no rebound. No Bruits. Bowel sounds: normal  RECTAL: N/A GU: N/A  EXTREMETIES: nonedematous.  MUSCULOSKELETAL:  Spine: normal Joints: intact  NEUROLOGIC: oriented to time,place and person; nonfocal. Strength is  normal Sensory is normal Reflexes are normal Cranial Nerves are normal.  Results for orders placed in visit on 10/29/12  CMP14+EGFR      Result Value Range   Glucose 83  65 - 99 mg/dL   BUN 18  6 - 24 mg/dL   Creatinine, Ser 1.03  0.76 - 1.27 mg/dL   GFR calc non Af Amer 89  >59 mL/min/1.73   GFR calc Af Amer 103  >59 mL/min/1.73   BUN/Creatinine Ratio 17  9 - 20   Sodium 138  134 - 144 mmol/L   Potassium 4.2  3.5 - 5.2 mmol/L   Chloride 98  97 - 108 mmol/L   CO2 21  18 - 29 mmol/L   Calcium 9.7  8.7 - 10.2 mg/dL   Total Protein 7.2  6.0 - 8.5 g/dL   Albumin 4.6  3.5 - 5.5 g/dL   Globulin, Total 2.6  1.5 - 4.5 g/dL   Albumin/Globulin Ratio 1.8  1.1 - 2.5   Total Bilirubin 0.5  0.0 - 1.2 mg/dL   Alkaline Phosphatase 65  39 - 117 IU/L   AST 23  0 - 40 IU/L   ALT 25  0 - 44 IU/L  NMR, LIPOPROFILE      Result Value Range   LDL Particle Number [id number] (*) <1000 nmol/L   LDLC SERPL CALC-MCNC 102 (*) <100 mg/dL   HDL Cholesterol by NMR 44  >=40 mg/dL   Triglycerides by NMR 114  <150 mg/dL   Cholesterol 169  <200 mg/dL   HDL Particle Number 36.4  >=30.5 umol/L   Small LDL Particle Number 1020 (*) <=527 nmol/L   LDL Size 20.6  >20.5 nm   LP-IR Score 58 (*) <=45    ASSESSMENT: Insomnia - Plan: zolpidem (AMBIEN) 10 MG tablet  GERD (gastroesophageal reflux disease)  HTN (hypertension) - Plan: trandolapril-verapamil (TARKA) 4-240 MG per tablet, CMP14+EGFR  HLD (hyperlipidemia) - Plan: ezetimibe-simvastatin (VYTORIN) 10-20 MG per tablet, CMP14+EGFR, Lipid panel  PLAN:      Dr Paula Libra Recommendations  For nutrition information, I recommend books:  1).Eat to Live by Dr Excell Seltzer. 2).Prevent and Reverse Heart Disease by Dr Karl Luke. 3) Dr Janene Harvey Book:  Program to Reverse Diabetes  Exercise recommendations are:  If unable to walk, then the patient can exercise in a chair 3 times a day. By flapping arms like a bird gently and raising legs outwards to  the front.  If ambulatory, the patient can go for walks for 30 minutes 3 times a week. Then increase the intensity and duration as tolerated.  Goal is to try to attain exercise frequency to 5 times a week.  If applicable: Best to perform resistance exercises (machines or weights) 2 days a week and cardio type exercises 3 days per week.  Orders Placed This Encounter  Procedures  . CMP14+EGFR    Standing Status: Future     Number of Occurrences:      Standing Expiration Date: 03/01/2014    Order Specific Question:  Has  the patient fasted?    Answer:  Yes  . Lipid panel    Standing Status: Future     Number of Occurrences:      Standing Expiration Date: 03/01/2014    Order Specific Question:  Has the patient fasted?    Answer:  Yes   Meds ordered this encounter  Medications  . trandolapril-verapamil (TARKA) 4-240 MG per tablet    Sig: TAKE 1 TABLET EVERY DAY    Dispense:  90 tablet    Refill:  3  . zolpidem (AMBIEN) 10 MG tablet    Sig: TAKE 1 TABLET AT BEDTIME    Dispense:  30 tablet    Refill:  2  . ezetimibe-simvastatin (VYTORIN) 10-20 MG per tablet    Sig: Take 1 tablet by mouth at bedtime.    Dispense:  90 tablet    Refill:  3  samples for vytorin 10/20 given for 4 weeks.  Medications Discontinued During This Encounter  Medication Reason  . ezetimibe (ZETIA) 10 MG tablet Side effect (s)  . atorvastatin (LIPITOR) 40 MG tablet Side effect (s)  . TARKA 4-240 MG per tablet Reorder  . zolpidem (AMBIEN) 10 MG tablet Reorder   Return in about 4 months (around 06/29/2013), or 6 weeks lab appointment, for Recheck medical problems.  Jaquanda Wickersham P. Jacelyn Grip, M.D.

## 2013-03-04 ENCOUNTER — Telehealth: Payer: Self-pay | Admitting: *Deleted

## 2013-03-04 NOTE — Telephone Encounter (Signed)
Pharmacy sent fax stating Vytorin 10-20 and Tarka ER 4-240mg  may interact based on the potential interaction between lovastatin(>20mg ) Simvastatin (>10mg ) and verapamil. Please advise and contact pharmacy

## 2013-03-06 NOTE — Telephone Encounter (Signed)
Have patient stop the vytorin and make an appointment with Tammy or Marcelino DusterMichelle to review and adjust medications especially in view of his risks with drug interaction.

## 2013-03-07 NOTE — Telephone Encounter (Signed)
PAtient aware he has an appointment with Dr.wong he just wants to go over it with him

## 2013-04-12 ENCOUNTER — Other Ambulatory Visit: Payer: BC Managed Care – PPO

## 2013-06-01 ENCOUNTER — Other Ambulatory Visit: Payer: Self-pay | Admitting: Family Medicine

## 2013-06-02 NOTE — Telephone Encounter (Signed)
Patient last seen in office on 03-01-13. Rx last filled on 05-04-13. Please advise. If approved please route to Pool A so nurse can phone in to pharmacy

## 2013-06-03 NOTE — Telephone Encounter (Signed)
Patient needs to be seen. Has exceeded time since last visit. Limited quantity refilled. Needs to bring all medications to next appointment.   

## 2013-06-04 ENCOUNTER — Other Ambulatory Visit: Payer: Self-pay | Admitting: Family Medicine

## 2013-06-06 NOTE — Telephone Encounter (Signed)
Approved on 06/01/13 with no refills.  [facility] detailed [facility].

## 2013-06-06 NOTE — Telephone Encounter (Signed)
Rx was filled on 06/01/2013 Refill denied. Bring all medications at next office visit.

## 2013-06-30 ENCOUNTER — Ambulatory Visit (INDEPENDENT_AMBULATORY_CARE_PROVIDER_SITE_OTHER): Payer: BC Managed Care – PPO | Admitting: Family Medicine

## 2013-06-30 ENCOUNTER — Encounter: Payer: Self-pay | Admitting: Family Medicine

## 2013-06-30 VITALS — BP 130/81 | HR 63 | Temp 97.3°F | Ht 72.0 in | Wt 187.4 lb

## 2013-06-30 DIAGNOSIS — I10 Essential (primary) hypertension: Secondary | ICD-10-CM

## 2013-06-30 DIAGNOSIS — G4700 Insomnia, unspecified: Secondary | ICD-10-CM

## 2013-06-30 DIAGNOSIS — K219 Gastro-esophageal reflux disease without esophagitis: Secondary | ICD-10-CM

## 2013-06-30 DIAGNOSIS — E785 Hyperlipidemia, unspecified: Secondary | ICD-10-CM

## 2013-06-30 DIAGNOSIS — N200 Calculus of kidney: Secondary | ICD-10-CM

## 2013-06-30 MED ORDER — ZOLPIDEM TARTRATE 10 MG PO TABS: ORAL_TABLET | ORAL | Status: AC

## 2013-06-30 NOTE — Progress Notes (Signed)
Patient ID: Russell Gregory, male   DOB: 10-05-1970, 43 y.o.   MRN: [id number] SUBJECTIVE: CC: Chief Complaint  Patient presents with  . Hypertension    4 month recheck  . Hyperlipidemia    HPI: Patient is here for follow up of hyperlipidemia/HTN: denies Headache;denies Chest Pain;denies weakness;denies Shortness of Breath and orthopnea;denies Visual changes;denies palpitations;denies cough;denies pedal edema;denies symptoms of TIA or stroke;deniesClaudication symptoms. admits to Compliance with medications; denies Problems with medications.    Past Medical History  Diagnosis Date  . Inguinal hernia   . Hyperlipidemia   . Hypertension   . GERD (gastroesophageal reflux disease)    Past Surgical History  Procedure Laterality Date  . Tonsillectomy    . Hernia repair      left inguinal repair   History   Social History  . Marital Status: Married    Spouse Name: N/A    Number of Children: N/A  . Years of Education: N/A   Occupational History  . Not on file.   Social History Main Topics  . Smoking status: Never Smoker   . Smokeless tobacco: Not on file  . Alcohol Use: No  . Drug Use: No  . Sexual Activity:    Other Topics Concern  . Not on file   Social History Narrative  . No narrative on file   No family history on file. Current Outpatient Prescriptions on File Prior to Visit  Medication Sig Dispense Refill  . trandolapril-verapamil (TARKA) 4-240 MG per tablet TAKE 1 TABLET EVERY DAY  90 tablet  3   No current facility-administered medications on file prior to visit.   Allergies  Allergen Reactions  . Morphine And Related    Immunization History  Administered Date(s) Administered  . Influenza-Unspecified 12/30/2012  . Tdap 10/29/2012   Prior to Admission medications   Medication Sig Start Date End Date Taking? Authorizing Provider  trandolapril-verapamil Preston Fleeting) 4-240 MG per tablet TAKE 1 TABLET EVERY DAY 03/01/13  Yes Vernie Shanks, MD  zolpidem  (AMBIEN) 10 MG tablet TAKE 1 TABLET BY MOUTH AT BEDTIME   Yes Vernie Shanks, MD  etodolac (LODINE) 400 MG tablet Take 400 mg by mouth daily. 06/10/13   Historical Provider, MD     ROS: As above in the HPI. All other systems are stable or negative.  OBJECTIVE: APPEARANCE:  Patient in no acute distress.The patient appeared well nourished and normally developed. Acyanotic. Waist: VITAL SIGNS:BP 130/81  Pulse 63  Temp(Src) 97.3 F (36.3 C) (Oral)  Ht 6' (1.829 m)  Wt 187 lb 6.4 oz (85.004 kg)  BMI 25.41 kg/m2 WM Worked night shift  SKIN: warm and  Dry without overt rashes, tattoos and scars  HEAD and Neck: without JVD, Head and scalp: normal Eyes:No scleral icterus. Fundi normal, eye movements normal. Ears: Auricle normal, canal normal, Tympanic membranes normal, insufflation normal. Nose: normal Throat: normal Neck & thyroid: normal  CHEST & LUNGS: Chest wall: normal Lungs: Clear  CVS: Reveals the PMI to be normally located. Regular rhythm, First and Second Heart sounds are normal,  absence of murmurs, rubs or gallops. Peripheral vasculature: Radial pulses: normal Dorsal pedis pulses: normal Posterior pulses: normal  ABDOMEN:  Appearance: normal Benign, no organomegaly, no masses, no Abdominal Aortic enlargement. No Guarding , no rebound. No Bruits. Bowel sounds: normal  RECTAL: N/A GU: N/A  EXTREMETIES: nonedematous.  MUSCULOSKELETAL:  Spine: normal Joints: intact  NEUROLOGIC: oriented to time,place and person; nonfocal. Strength is normal Sensory is normal Reflexes are  normal Cranial Nerves are normal. Results for orders placed in visit on 10/29/12  CMP14+EGFR      Result Value Ref Range   Glucose 83  65 - 99 mg/dL   BUN 18  6 - 24 mg/dL   Creatinine, Ser 1.03  0.76 - 1.27 mg/dL   GFR calc non Af Amer 89  >59 mL/min/1.73   GFR calc Af Amer 103  >59 mL/min/1.73   BUN/Creatinine Ratio 17  9 - 20   Sodium 138  134 - 144 mmol/L   Potassium 4.2   3.5 - 5.2 mmol/L   Chloride 98  97 - 108 mmol/L   CO2 21  18 - 29 mmol/L   Calcium 9.7  8.7 - 10.2 mg/dL   Total Protein 7.2  6.0 - 8.5 g/dL   Albumin 4.6  3.5 - 5.5 g/dL   Globulin, Total 2.6  1.5 - 4.5 g/dL   Albumin/Globulin Ratio 1.8  1.1 - 2.5   Total Bilirubin 0.5  0.0 - 1.2 mg/dL   Alkaline Phosphatase 65  39 - 117 IU/L   AST 23  0 - 40 IU/L   ALT 25  0 - 44 IU/L  NMR, LIPOPROFILE      Result Value Ref Range   LDL Particle Number [id number] (*) <1000 nmol/L   LDLC SERPL CALC-MCNC 102 (*) <100 mg/dL   HDL Cholesterol by NMR 44  >=40 mg/dL   Triglycerides by NMR 114  <150 mg/dL   Cholesterol 169  <200 mg/dL   HDL Particle Number 36.4  >=30.5 umol/L   Small LDL Particle Number [id number] (*) <=527 nmol/L   LDL Size 20.6  >20.5 nm   LP-IR Score 58 (*) <=45    ASSESSMENT: HTN (hypertension) - Plan: CMP14+EGFR  HLD (hyperlipidemia) - Plan: Lipid panel  GERD (gastroesophageal reflux disease)  Kidney calculi  Insomnia - Plan: zolpidem (AMBIEN) 10 MG tablet  PLAN: Dash diet in the AVS       Dr Paula Libra Recommendations  For nutrition information, I recommend books:  1).Eat to Live by Dr Excell Seltzer. 2).Prevent and Reverse Heart Disease by Dr Karl Luke. 3) Dr Janene Harvey Book:  Program to Reverse Diabetes  Exercise recommendations are:  If unable to walk, then the patient can exercise in a chair 3 times a day. By flapping arms like a bird gently and raising legs outwards to the front.  If ambulatory, the patient can go for walks for 30 minutes 3 times a week. Then increase the intensity and duration as tolerated.  Goal is to try to attain exercise frequency to 5 times a week.  If applicable: Best to perform resistance exercises (machines or weights) 2 days a week and cardio type exercises 3 days per week.   Orders Placed This Encounter  Procedures  . CMP14+EGFR  . Lipid panel   Meds ordered this encounter  Medications  . etodolac (LODINE) 400 MG  tablet    Sig: Take 400 mg by mouth daily.  . Omega-3 Fatty Acids (FISH OIL) 1000 MG CAPS    Sig: Take 2,000 mg by mouth.  . Soybean Lecithin (LECITHIN SOYA) POWD    Sig: 2 scoop by Does not apply route.  [name] zolpidem (AMBIEN) 10 MG tablet    Sig: TAKE 1 TABLET BY MOUTH AT BEDTIME    Dispense:  30 tablet    Refill:  3   Medications Discontinued During This Encounter  Medication Reason  . ezetimibe-simvastatin (VYTORIN) 10-20 MG per  tablet Prescription never filled  . esomeprazole (NEXIUM) 40 MG capsule Patient Preference  . zolpidem (AMBIEN) 10 MG tablet Reorder   Return in about 4 months (around 10/31/2013) for Recheck medical problems.  Laelynn Blizzard P. Jacelyn Grip, M.D.

## 2013-06-30 NOTE — Patient Instructions (Signed)
DASH Diet The DASH diet stands for "Dietary Approaches to Stop Hypertension." It is a healthy eating plan that has been shown to reduce high blood pressure (hypertension) in as little as 14 days, while also possibly providing other significant health benefits. These other health benefits include reducing the risk of breast cancer after menopause and reducing the risk of type 2 diabetes, heart disease, colon cancer, and stroke. Health benefits also include weight loss and slowing kidney failure in patients with chronic kidney disease.  DIET GUIDELINES  Limit salt (sodium). Your diet should contain less than 1500 mg of sodium daily.  Limit refined or processed carbohydrates. Your diet should include mostly whole grains. Desserts and added sugars should be used sparingly.  Include small amounts of heart-healthy fats. These types of fats include nuts, oils, and tub margarine. Limit saturated and trans fats. These fats have been shown to be harmful in the body. CHOOSING FOODS  The following food groups are based on a 2000 calorie diet. See your Registered Dietitian for individual calorie needs. Grains and Grain Products (6 to 8 servings daily)  Eat More Often: Whole-wheat bread, brown rice, whole-grain or wheat pasta, quinoa, popcorn without added fat or salt (air popped).  Eat Less Often: White bread, white pasta, white rice, cornbread. Vegetables (4 to 5 servings daily)  Eat More Often: Fresh, frozen, and canned vegetables. Vegetables may be raw, steamed, roasted, or grilled with a minimal amount of fat.  Eat Less Often/Avoid: Creamed or fried vegetables. Vegetables in a cheese sauce. Fruit (4 to 5 servings daily)  Eat More Often: All fresh, canned (in natural juice), or frozen fruits. Dried fruits without added sugar. One hundred percent fruit juice ( cup [237 mL] daily).  Eat Less Often: Dried fruits with added sugar. Canned fruit in light or heavy syrup. Lean Meats, Fish, and Poultry (2  servings or less daily. One serving is 3 to 4 oz [85-114 g]).  Eat More Often: Ninety percent or leaner ground beef, tenderloin, sirloin. Round cuts of beef, chicken breast, turkey breast. All fish. Grill, bake, or broil your meat. Nothing should be fried.  Eat Less Often/Avoid: Fatty cuts of meat, turkey, or chicken leg, thigh, or wing. Fried cuts of meat or fish. Dairy (2 to 3 servings)  Eat More Often: Low-fat or fat-free milk, low-fat plain or light yogurt, reduced-fat or part-skim cheese.  Eat Less Often/Avoid: Milk (whole, 2%).Whole milk yogurt. Full-fat cheeses. Nuts, Seeds, and Legumes (4 to 5 servings per week)  Eat More Often: All without added salt.  Eat Less Often/Avoid: Salted nuts and seeds, canned beans with added salt. Fats and Sweets (limited)  Eat More Often: Vegetable oils, tub margarines without trans fats, sugar-free gelatin. Mayonnaise and salad dressings.  Eat Less Often/Avoid: Coconut oils, palm oils, butter, stick margarine, cream, half and half, cookies, candy, pie. FOR MORE INFORMATION The Dash Diet Eating Plan: www.dashdiet.org Document Released: 01/30/2011 Document Revised: 05/05/2011 Document Reviewed: 01/30/2011 [employer] Patient Information 2014 [employer].        Dr Kenneshia Rehm's Recommendations  For nutrition information, I recommend books:  1).Eat to Live by Dr Joel Fuhrman. 2).Prevent and Reverse Heart Disease by Dr Caldwell Esselstyn. 3) Dr Neal Barnard's Book:  Program to Reverse Diabetes  Exercise recommendations are:  If unable to walk, then the patient can exercise in a chair 3 times a day. By flapping arms like a bird gently and raising legs outwards to the front.  If ambulatory, the patient can go for   walks for 30 minutes 3 times a week. Then increase the intensity and duration as tolerated.  Goal is to try to attain exercise frequency to 5 times a week.  If applicable: Best to perform resistance exercises (machines or  weights) 2 days a week and cardio type exercises 3 days per week.  

## 2013-07-01 LAB — CMP14+EGFR
ALT: 15 IU/L (ref 0–44)
AST: 16 IU/L (ref 0–40)
Albumin/Globulin Ratio: 1.8 (ref 1.1–2.5)
Albumin: 4.5 g/dL (ref 3.5–5.5)
Alkaline Phosphatase: 59 IU/L (ref 39–117)
BUN/Creatinine Ratio: 19 (ref 9–20)
BUN: 19 mg/dL (ref 6–24)
CO2: 25 mmol/L (ref 18–29)
Calcium: 9.3 mg/dL (ref 8.7–10.2)
Chloride: 99 mmol/L (ref 97–108)
Creatinine, Ser: 1 mg/dL (ref 0.76–1.27)
GFR calc Af Amer: 106 mL/min/{1.73_m2} (ref >59)
GFR calc non Af Amer: 92 mL/min/{1.73_m2} (ref >59)
Globulin, Total: 2.5 g/dL (ref 1.5–4.5)
Glucose: 85 mg/dL (ref 65–99)
Potassium: 4.5 mmol/L (ref 3.5–5.2)
Sodium: 138 mmol/L (ref 134–144)
Total Bilirubin: 0.5 mg/dL (ref 0.0–1.2)
Total Protein: 7 g/dL (ref 6.0–8.5)

## 2013-07-01 LAB — LIPID PANEL
Chol/HDL Ratio: 5.3 ratio units — ABNORMAL HIGH (ref 0.0–5.0)
Cholesterol, Total: 207 mg/dL — ABNORMAL HIGH (ref 100–199)
HDL: 39 mg/dL — ABNORMAL LOW (ref >39)
LDL Calculated: 142 mg/dL — ABNORMAL HIGH (ref 0–99)
Triglycerides: 129 mg/dL (ref 0–149)
VLDL Cholesterol Cal: 26 mg/dL (ref 5–40)

## 2013-07-04 ENCOUNTER — Telehealth: Payer: Self-pay | Admitting: *Deleted

## 2013-07-04 NOTE — Telephone Encounter (Signed)
Message copied by Almeta MonasSTONE, JANIE M on Mon Jul 04, 2013  4:53 PM ------      Message from: Ileana LaddWONG, FRANCIS P      Created: Fri Jul 01, 2013 10:35 AM       Labs which are not at goal:      Lipids are not at goal            The rest are at goal.            Recommend the following:       Plant based  Diet and exercise.      If not at goal at the next visit , you will need to be on a statin.                          ------

## 2013-07-04 NOTE — Telephone Encounter (Signed)
Labs are fine except cholesterol. Eat plant based diet, exercise and if no improvement ,may need medicine at 3 month follow up.

## 2013-12-01 ENCOUNTER — Ambulatory Visit (HOSPITAL_BASED_OUTPATIENT_CLINIC_OR_DEPARTMENT_OTHER)
Admission: RE | Admit: 2013-12-01 | Discharge: 2013-12-01 | Disposition: A | Discharge status: Home / Self Care | Payer: BC Managed Care – PPO | Source: Ambulatory Visit | Attending: Urology | Admitting: Urology

## 2013-12-01 ENCOUNTER — Other Ambulatory Visit: Payer: Self-pay | Admitting: Urology

## 2013-12-01 ENCOUNTER — Encounter (HOSPITAL_BASED_OUTPATIENT_CLINIC_OR_DEPARTMENT_OTHER)
Admission: RE | Disposition: A | Discharge status: Home / Self Care | Payer: Self-pay | Source: Ambulatory Visit | Attending: Urology

## 2013-12-01 SURGERY — CYSTOURETEROSCOPY, WITH RETROGRADE PYELOGRAM AND STENT INSERTION
Anesthesia: General

## 2013-12-01 SURGICAL SUPPLY — 32 items
BAG DRAIN URO-CYSTO SKYTR STRL (DRAIN) ×1 IMPLANT
BAG DRN UROCATH (DRAIN)
BASKET LASER NITINOL 1.9FR (BASKET) IMPLANT
BASKET ZERO TIP NITINOL 2.4FR (BASKET) IMPLANT
BSKT STON RTRVL 120 1.9FR (BASKET)
BSKT STON RTRVL ZERO TP 2.4FR (BASKET)
CANISTER SUCT LVC 12 LTR MEDI- (MISCELLANEOUS) IMPLANT
CATH URET 5FR 28IN CONE TIP (BALLOONS)
CATH URET 5FR 28IN OPEN ENDED (CATHETERS) IMPLANT
CATH URET 5FR 70CM CONE TIP (BALLOONS) IMPLANT
CLOTH BEACON ORANGE TIMEOUT ST (SAFETY) ×1 IMPLANT
DRAPE CAMERA CLOSED 9X96 (DRAPES) ×1 IMPLANT
ELECT REM PT RETURN 9FT ADLT (ELECTROSURGICAL)
ELECTRODE REM PT RTRN 9FT ADLT (ELECTROSURGICAL) IMPLANT
FIBER LASER FLEXIVA 1000 (UROLOGICAL SUPPLIES) IMPLANT
FIBER LASER FLEXIVA 200 (UROLOGICAL SUPPLIES) IMPLANT
FIBER LASER FLEXIVA 365 (UROLOGICAL SUPPLIES) IMPLANT
FIBER LASER FLEXIVA 550 (UROLOGICAL SUPPLIES) IMPLANT
GLOVE SURG SS PI 8.0 STRL IVOR (GLOVE) ×1 IMPLANT
GOWN PREVENTION PLUS LG XLONG (DISPOSABLE) IMPLANT
GOWN STRL REIN XL XLG (GOWN DISPOSABLE) ×1 IMPLANT
GUIDEWIRE 0.038 PTFE COATED (WIRE) IMPLANT
GUIDEWIRE ANG ZIPWIRE 038X150 (WIRE) IMPLANT
GUIDEWIRE STR DUAL SENSOR (WIRE) ×1 IMPLANT
IV NS IRRIG 3000ML ARTHROMATIC (IV SOLUTION) ×1 IMPLANT
KIT BALLIN UROMAX 15FX10 (LABEL) IMPLANT
KIT BALLN UROMAX 15FX4 (MISCELLANEOUS) IMPLANT
KIT BALLN UROMAX 26 75X4 (MISCELLANEOUS)
PACK CYSTO (CUSTOM PROCEDURE TRAY) ×1 IMPLANT
SET HIGH PRES BAL DIL (LABEL)
SHEATH ACCESS URETERAL 38CM (SHEATH) IMPLANT
SHEATH ACCESS URETERAL 54CM (SHEATH) IMPLANT

## 2013-12-01 NOTE — Anesthesia Preprocedure Evaluation (Addendum)
Anesthesia Evaluation  Patient identified by MRN, date of birth, ID band Patient awake    Reviewed: Allergy & Precautions, H&P , NPO status , Patient's Chart, lab work & pertinent test results  History of Anesthesia Complications Negative for: history of anesthetic complications  Airway Mallampati: II TM Distance: >3 FB Neck ROM: Full    Dental no notable dental hx. (+) Dental Advisory Given, Teeth Intact   Pulmonary neg pulmonary ROS,  breath sounds clear to auscultation  Pulmonary exam normal       Cardiovascular Exercise Tolerance: Good hypertension, Pt. on medications Rhythm:Regular Rate:Normal     Neuro/Psych negative neurological ROS  negative psych ROS   GI/Hepatic Neg liver ROS, GERD-  Medicated and Controlled,  Endo/Other  negative endocrine ROS  Renal/GU Renal diseasenegative Renal ROS     Musculoskeletal negative musculoskeletal ROS (+)   Abdominal   Peds  Hematology negative hematology ROS (+)   Anesthesia Other Findings   Reproductive/Obstetrics negative OB ROS                          Anesthesia Physical Anesthesia Plan  ASA: II  Anesthesia Plan: General   Post-op Pain Management:    Induction: Intravenous  Airway Management Planned: LMA  Additional Equipment:   Intra-op Plan:   Post-operative Plan: Extubation in OR  Informed Consent: I have reviewed the patients History and Physical, chart, labs and discussed the procedure including the risks, benefits and alternatives for the proposed anesthesia with the patient or authorized representative who has indicated his/her understanding and acceptance.   Dental advisory given  Plan Discussed with: CRNA  Anesthesia Plan Comments:         Anesthesia Quick Evaluation

## 2013-12-01 NOTE — H&P (Signed)
Active Problems Problems  1. Calculus of ureter (592.1) 2. Microscopic hematuria (599.72) 3. Nephrolithiasis (592.0)  History of Present Illness [name] is a [age] yo WM former patient of Dr. Laverle PatterBorden who was last seen for a vasectomy in 2009. He returns today with a one month history of dysuria and frequency. The saw Dr. Modesto CharonWong who gave him antibiotics which helped for a few days and then the symptoms returned.  He has a good stream. He has no nocturia. He has had no hematuria.  He has intermittent urgency. He has had some suprapubic discomfort over the last couple of days but he has had no flank pain. He has had prior stones and he passed one 5 years ago but was told he still had a stone on a films a couple of years ago.  He has 3-6 RBC's on the UA today.   Past Medical History Problems  1. History of Encounter for contraceptive planning (V25.09) 2. History of arthritis (V13.4) 3. History of esophageal reflux (V12.79) 4. History of hypercholesterolemia (V12.29) 5. History of hypertension (V12.59) 6. History of kidney stones (V13.01)  Surgical History Problems  1. History of Hernia Repair 2. History of Surgery Of Male Genitalia Vasectomy  Current Meds 1. Ambien TABS;  Therapy: (Recorded:10Sep2015) to Recorded 2. Tarka 2-240 MG Oral Tablet Extended Release;  Therapy: (Recorded:10Sep2015) to Recorded  He is on a NSAID chronically.   Allergies Medication  1. No Known Drug Allergies  Family History Problems  1. No pertinent family history : Mother  Social History Problems    Alcohol Use   1 glass per day   Never a smoker   Occupation   Designer, television/film setperator   Denied: Tobacco Use  Review of Systems Genitourinary, constitutional, skin, eye, otolaryngeal, hematologic/lymphatic, cardiovascular, pulmonary, endocrine, musculoskeletal, gastrointestinal, neurological and psychiatric system(s) were reviewed and pertinent findings if present are noted.  Genitourinary: urinary frequency  and dysuria.    Vitals Vital Signs [Data Includes: Last 1 Day]  Recorded: 10Sep2015 01:34PM  Height: 5 ft 11 in Weight: 180 lb  BMI Calculated: 25.11 BSA Calculated: 2.02 Blood Pressure: 125 / 87 Temperature: 98.1 F Heart Rate: 56  Physical Exam Constitutional: Well nourished and well developed . No acute distress.  Pulmonary: No respiratory distress and normal respiratory rhythm and effort.  Cardiovascular: Heart rate and rhythm are normal . No peripheral edema.  Abdomen: The abdomen is soft and nontender. No masses are palpated. No CVA tenderness. No hernias are palpable. No hepatosplenomegaly noted.    Results/Data Urine [Data Includes: Last 1 Day]   10Sep2015  COLOR YELLOW   APPEARANCE CLEAR   SPECIFIC GRAVITY 1.025   pH 5.5   GLUCOSE NEG mg/dL  BILIRUBIN MOD   KETONE NEG mg/dL  BLOOD TRACE   PROTEIN NEG mg/dL  UROBILINOGEN 0.2 mg/dL  NITRITE NEG   LEUKOCYTE ESTERASE NEG   SQUAMOUS EPITHELIAL/HPF RARE   WBC 0-2 WBC/hpf  RBC 3-6 RBC/hpf  BACTERIA RARE   CRYSTALS NONE SEEN   CASTS NONE SEEN    The following images/tracing/specimen were independently visualized:  KUB today shows a possible 2mm LLP stone. I don't see any right renal stones or obvious ureteral stones. There is gas and stool in the rectum which could obscure a distal stone. He has otherwise normal bones, gas patterns and soft tissue shadows. I have reviewed his CT from 2013 and he has a small stone in each kidney.  The following clinical lab reports were reviewed:  UA reviewed.  Assessment Assessed  1. Microscopic hematuria (599.72) 2. Calculus of ureter (592.1)  He has irritative voiding symptoms with some suprapubic pain and microhematuria that is probably secondary to a small ureteral stone.   Plan Calculus of ureter  1. Start: Tamsulosin HCl - 0.4 MG Oral Capsule; TAKE 1 CAPSULE Daily 2. Start: Uribel 118 MG Oral Capsule; take 1 capsule   4  times a day prn 3. Follow-up MD/NP/PA Office   Follow-up  Status: Hold For - Appointment,Date of Service   Requested for: 10Sep2015 Health Maintenance  4. UA With REFLEX; [Do Not Release]; Status:Resulted - Requires Verification;   Done:  10Sep2015 01:19PM Microscopic hematuria  5. KUB; Status:Resulted - Requires Verification;   Done: 10Sep2015 12:00AM  I am going to start him on tamsulosin and reviewed teh side effects.  I have given him uribel samples for the irritation and he is not having pain that is enough to require pain meds.  He will return in 3-4 weeks and if he still has symptoms or hematuria, he will need a CT and possible cystoscopy.   Discussion/Summary CC: Dr. Leodis SiasFrancis Wong.   He was back in today with persistent symptoms and a CT urogram shows a 4mm RUVJ stone.   We will proceed with ureteroscopic extraction later today.

## 2013-12-02 ENCOUNTER — Encounter (HOSPITAL_BASED_OUTPATIENT_CLINIC_OR_DEPARTMENT_OTHER): Payer: Self-pay | Admitting: *Deleted

## 2013-12-02 NOTE — Progress Notes (Signed)
Pt instructed npo p mn 10/12 x clear liquids until 0600, then absolutely nothing by mouth.  May take nexium and tarka w sip of water.  To [facility] 10/13 @ 1230.  Needs istat on arrival.

## 2013-12-02 NOTE — Progress Notes (Signed)
12/02/13 1130  OBSTRUCTIVE SLEEP APNEA  Have you ever been diagnosed with sleep apnea through a sleep study? No  Do you snore loudly (loud enough to be heard through closed doors)?  1  Do you often feel tired, fatigued, or sleepy during the daytime? 1  Has anyone observed you stop breathing during your sleep? 0  Do you have, or are you being treated for high blood pressure? 1  BMI more than 35 kg/m2? 0  Age over 44 years old? 0  Neck circumference greater than 40 cm/16 inches? 0  Gender: 1  Obstructive Sleep Apnea Score 4

## 2013-12-06 ENCOUNTER — Ambulatory Visit (HOSPITAL_BASED_OUTPATIENT_CLINIC_OR_DEPARTMENT_OTHER)
Admission: RE | Admit: 2013-12-06 | Discharge: 2013-12-06 | Disposition: A | Discharge status: Home / Self Care | Payer: BC Managed Care – PPO | Source: Ambulatory Visit | Attending: Urology | Admitting: Urology

## 2013-12-06 ENCOUNTER — Ambulatory Visit (HOSPITAL_BASED_OUTPATIENT_CLINIC_OR_DEPARTMENT_OTHER): Payer: BC Managed Care – PPO | Admitting: Anesthesiology

## 2013-12-06 ENCOUNTER — Encounter (HOSPITAL_BASED_OUTPATIENT_CLINIC_OR_DEPARTMENT_OTHER): Payer: Self-pay | Admitting: *Deleted

## 2013-12-06 ENCOUNTER — Encounter (HOSPITAL_BASED_OUTPATIENT_CLINIC_OR_DEPARTMENT_OTHER): Payer: BC Managed Care – PPO | Admitting: Anesthesiology

## 2013-12-06 ENCOUNTER — Encounter (HOSPITAL_BASED_OUTPATIENT_CLINIC_OR_DEPARTMENT_OTHER)
Admission: RE | Disposition: A | Discharge status: Home / Self Care | Payer: Self-pay | Source: Ambulatory Visit | Attending: Urology

## 2013-12-06 DIAGNOSIS — E780 Pure hypercholesterolemia: Secondary | ICD-10-CM | POA: Insufficient documentation

## 2013-12-06 DIAGNOSIS — K219 Gastro-esophageal reflux disease without esophagitis: Secondary | ICD-10-CM | POA: Diagnosis not present

## 2013-12-06 DIAGNOSIS — N201 Calculus of ureter: Secondary | ICD-10-CM | POA: Insufficient documentation

## 2013-12-06 DIAGNOSIS — N138 Other obstructive and reflux uropathy: Secondary | ICD-10-CM | POA: Diagnosis not present

## 2013-12-06 DIAGNOSIS — I10 Essential (primary) hypertension: Secondary | ICD-10-CM | POA: Diagnosis not present

## 2013-12-06 DIAGNOSIS — R312 Other microscopic hematuria: Secondary | ICD-10-CM | POA: Insufficient documentation

## 2013-12-06 DIAGNOSIS — M1990 Unspecified osteoarthritis, unspecified site: Secondary | ICD-10-CM | POA: Insufficient documentation

## 2013-12-06 DIAGNOSIS — N401 Enlarged prostate with lower urinary tract symptoms: Secondary | ICD-10-CM | POA: Insufficient documentation

## 2013-12-06 DIAGNOSIS — N3289 Other specified disorders of bladder: Secondary | ICD-10-CM | POA: Diagnosis not present

## 2013-12-06 HISTORY — DX: Urethral syndrome, unspecified: N34.3

## 2013-12-06 HISTORY — DX: Insomnia, unspecified: G47.00

## 2013-12-06 HISTORY — PX: CYSTOSCOPY WITH RETROGRADE PYELOGRAM, URETEROSCOPY AND STENT PLACEMENT: SHX5789

## 2013-12-06 HISTORY — DX: Calculus of ureter: N20.1

## 2013-12-06 LAB — POCT I-STAT 4, (NA,K, GLUC, HGB,HCT)
Glucose, Bld: 84 mg/dL (ref 70–99)
HEMATOCRIT: 45 % (ref 39.0–52.0)
HEMOGLOBIN: 15.3 g/dL (ref 13.0–17.0)
Potassium: 4.4 mEq/L (ref 3.7–5.3)
SODIUM: 135 meq/L — AB (ref 137–147)

## 2013-12-06 SURGERY — CYSTOURETEROSCOPY, WITH RETROGRADE PYELOGRAM AND STENT INSERTION
Anesthesia: General | Site: Ureter | Laterality: Right

## 2013-12-06 MED ORDER — FENTANYL CITRATE 0.05 MG/ML IJ SOLN
INTRAMUSCULAR | Status: AC
  Filled 2013-12-06: qty 6

## 2013-12-06 MED ORDER — SODIUM CHLORIDE 0.9 % IJ SOLN
3.0000 mL | Freq: Two times a day (BID) | INTRAMUSCULAR | Status: DC
  Filled 2013-12-06: qty 3

## 2013-12-06 MED ORDER — LACTATED RINGERS IV SOLN
INTRAVENOUS | Status: DC | PRN
  Administered 2013-12-06: 13:00:00 via INTRAVENOUS

## 2013-12-06 MED ORDER — PROMETHAZINE HCL 25 MG/ML IJ SOLN
6.2500 mg | INTRAMUSCULAR | Status: DC | PRN
  Filled 2013-12-06: qty 1

## 2013-12-06 MED ORDER — KETOROLAC TROMETHAMINE 15 MG/ML IJ SOLN
15.0000 mg | Freq: Four times a day (QID) | INTRAMUSCULAR | Status: DC
  Filled 2013-12-06: qty 1

## 2013-12-06 MED ORDER — STERILE WATER FOR IRRIGATION IR SOLN
Status: DC | PRN
  Administered 2013-12-06: 500 mL

## 2013-12-06 MED ORDER — MIDAZOLAM HCL 2 MG/2ML IJ SOLN
INTRAMUSCULAR | Status: AC
  Filled 2013-12-06: qty 2

## 2013-12-06 MED ORDER — PHENAZOPYRIDINE HCL 100 MG PO TABS
ORAL_TABLET | ORAL | Status: AC
  Filled 2013-12-06: qty 2

## 2013-12-06 MED ORDER — LIDOCAINE HCL 2 % EX GEL
CUTANEOUS | Status: DC | PRN
  Administered 2013-12-06: 1 via URETHRAL

## 2013-12-06 MED ORDER — ONDANSETRON HCL 4 MG/2ML IJ SOLN
INTRAMUSCULAR | Status: DC | PRN
  Administered 2013-12-06: 4 mg via INTRAVENOUS

## 2013-12-06 MED ORDER — ACETAMINOPHEN 10 MG/ML IV SOLN
INTRAVENOUS | Status: DC | PRN
  Administered 2013-12-06: 1000 mg via INTRAVENOUS

## 2013-12-06 MED ORDER — OXYCODONE HCL 5 MG PO TABS
ORAL_TABLET | ORAL | Status: AC
  Filled 2013-12-06: qty 1

## 2013-12-06 MED ORDER — CEFAZOLIN SODIUM-DEXTROSE 2-3 GM-% IV SOLR
2.0000 g | INTRAVENOUS | Status: AC
  Administered 2013-12-06: 2 g via INTRAVENOUS
  Filled 2013-12-06: qty 50

## 2013-12-06 MED ORDER — CEFAZOLIN SODIUM-DEXTROSE 2-3 GM-% IV SOLR
INTRAVENOUS | Status: AC
  Filled 2013-12-06: qty 50

## 2013-12-06 MED ORDER — SODIUM CHLORIDE 0.9 % IJ SOLN
3.0000 mL | INTRAMUSCULAR | Status: DC | PRN
  Filled 2013-12-06: qty 3

## 2013-12-06 MED ORDER — ACETAMINOPHEN 650 MG RE SUPP
650.0000 mg | RECTAL | Status: DC | PRN
  Filled 2013-12-06: qty 1

## 2013-12-06 MED ORDER — OXYCODONE HCL 5 MG PO TABS
5.0000 mg | ORAL_TABLET | ORAL | Status: DC | PRN
  Filled 2013-12-06: qty 2

## 2013-12-06 MED ORDER — PROPOFOL 10 MG/ML IV BOLUS
INTRAVENOUS | Status: DC | PRN
Start: 2013-12-06 — End: 2013-12-06
  Administered 2013-12-06: 250 mg via INTRAVENOUS

## 2013-12-06 MED ORDER — LACTATED RINGERS IV SOLN
INTRAVENOUS | Status: DC
  Administered 2013-12-06: 13:00:00 via INTRAVENOUS
  Filled 2013-12-06: qty 1000

## 2013-12-06 MED ORDER — PHENAZOPYRIDINE HCL 200 MG PO TABS
200.0000 mg | ORAL_TABLET | Freq: Once | ORAL | Status: AC
  Administered 2013-12-06: 200 mg via ORAL
  Filled 2013-12-06: qty 1

## 2013-12-06 MED ORDER — OXYCODONE HCL 5 MG PO TABS
5.0000 mg | ORAL_TABLET | ORAL | Status: DC | PRN
  Administered 2013-12-06: 5 mg via ORAL
  Filled 2013-12-06: qty 2

## 2013-12-06 MED ORDER — IOHEXOL 350 MG/ML SOLN
INTRAVENOUS | Status: DC | PRN
  Administered 2013-12-06: 3 mL

## 2013-12-06 MED ORDER — DEXAMETHASONE SODIUM PHOSPHATE 4 MG/ML IJ SOLN
INTRAMUSCULAR | Status: DC | PRN
  Administered 2013-12-06: 10 mg via INTRAVENOUS

## 2013-12-06 MED ORDER — SODIUM CHLORIDE 0.9 % IV SOLN
250.0000 mL | INTRAVENOUS | Status: DC | PRN
  Filled 2013-12-06: qty 250

## 2013-12-06 MED ORDER — ACETAMINOPHEN 325 MG PO TABS
650.0000 mg | ORAL_TABLET | ORAL | Status: DC | PRN
  Filled 2013-12-06: qty 2

## 2013-12-06 MED ORDER — KETOROLAC TROMETHAMINE 30 MG/ML IJ SOLN
INTRAMUSCULAR | Status: DC | PRN
Start: 2013-12-06 — End: 2013-12-06
  Administered 2013-12-06: 30 mg via INTRAVENOUS

## 2013-12-06 MED ORDER — SODIUM CHLORIDE 0.9 % IR SOLN
Status: DC | PRN
  Administered 2013-12-06: 4000 mL

## 2013-12-06 MED ORDER — TRAMADOL HCL 50 MG PO TABS: 50.0000 mg | ORAL_TABLET | Freq: Four times a day (QID) | ORAL | Status: DC | PRN

## 2013-12-06 MED ORDER — FENTANYL CITRATE 0.05 MG/ML IJ SOLN
INTRAMUSCULAR | Status: DC | PRN
  Administered 2013-12-06 (×2): 25 ug via INTRAVENOUS
  Administered 2013-12-06: 50 ug via INTRAVENOUS
  Administered 2013-12-06 (×4): 25 ug via INTRAVENOUS

## 2013-12-06 MED ORDER — MIDAZOLAM HCL 5 MG/5ML IJ SOLN
INTRAMUSCULAR | Status: DC | PRN
  Administered 2013-12-06 (×2): 1 mg via INTRAVENOUS

## 2013-12-06 MED ORDER — FENTANYL CITRATE 0.05 MG/ML IJ SOLN
25.0000 ug | INTRAMUSCULAR | Status: DC | PRN
  Filled 2013-12-06: qty 1

## 2013-12-06 MED ORDER — PHENAZOPYRIDINE HCL 200 MG PO TABS: 200.0000 mg | ORAL_TABLET | Freq: Three times a day (TID) | ORAL | Status: DC | PRN

## 2013-12-06 MED ORDER — LIDOCAINE HCL (CARDIAC) 20 MG/ML IV SOLN
INTRAVENOUS | Status: DC | PRN
  Administered 2013-12-06: 100 mg via INTRAVENOUS

## 2013-12-06 MED ORDER — CEFAZOLIN SODIUM 1-5 GM-% IV SOLN
1.0000 g | INTRAVENOUS | Status: DC
  Filled 2013-12-06: qty 50

## 2013-12-06 SURGICAL SUPPLY — 35 items
BAG DRAIN URO-CYSTO SKYTR STRL (DRAIN) ×2 IMPLANT
BAG DRN UROCATH (DRAIN) ×1
BASKET LASER NITINOL 1.9FR (BASKET) IMPLANT
BASKET ZERO TIP NITINOL 2.4FR (BASKET) ×1 IMPLANT
BSKT STON RTRVL 120 1.9FR (BASKET)
BSKT STON RTRVL ZERO TP 2.4FR (BASKET) ×1
CANISTER SUCT LVC 12 LTR MEDI- (MISCELLANEOUS) ×1 IMPLANT
CATH URET 5FR 28IN CONE TIP (BALLOONS)
CATH URET 5FR 28IN OPEN ENDED (CATHETERS) ×1 IMPLANT
CATH URET 5FR 70CM CONE TIP (BALLOONS) IMPLANT
CLOTH BEACON ORANGE TIMEOUT ST (SAFETY) ×2 IMPLANT
DRAPE CAMERA CLOSED 9X96 (DRAPES) ×2 IMPLANT
ELECT REM PT RETURN 9FT ADLT (ELECTROSURGICAL)
ELECTRODE REM PT RTRN 9FT ADLT (ELECTROSURGICAL) IMPLANT
FIBER LASER FLEXIVA 1000 (UROLOGICAL SUPPLIES) IMPLANT
FIBER LASER FLEXIVA 200 (UROLOGICAL SUPPLIES) IMPLANT
FIBER LASER FLEXIVA 365 (UROLOGICAL SUPPLIES) IMPLANT
FIBER LASER FLEXIVA 550 (UROLOGICAL SUPPLIES) IMPLANT
GLOVE SURG SS PI 8.0 STRL IVOR (GLOVE) ×2 IMPLANT
GOWN STRL REUS W/ TWL LRG LVL3 (GOWN DISPOSABLE) IMPLANT
GOWN STRL REUS W/ TWL XL LVL3 (GOWN DISPOSABLE) IMPLANT
GOWN STRL REUS W/TWL LRG LVL3 (GOWN DISPOSABLE) ×2
GOWN STRL REUS W/TWL XL LVL3 (GOWN DISPOSABLE) ×2
GUIDEWIRE 0.038 PTFE COATED (WIRE) IMPLANT
GUIDEWIRE ANG ZIPWIRE 038X150 (WIRE) IMPLANT
GUIDEWIRE STR DUAL SENSOR (WIRE) ×2 IMPLANT
IV NS IRRIG 3000ML ARTHROMATIC (IV SOLUTION) ×3 IMPLANT
KIT BALLIN UROMAX 15FX10 (LABEL) IMPLANT
KIT BALLN UROMAX 15FX4 (MISCELLANEOUS) IMPLANT
KIT BALLN UROMAX 26 75X4 (MISCELLANEOUS)
PACK CYSTO (CUSTOM PROCEDURE TRAY) ×2 IMPLANT
SET HIGH PRES BAL DIL (LABEL)
SHEATH ACCESS URETERAL 38CM (SHEATH) ×1 IMPLANT
SHEATH ACCESS URETERAL 54CM (SHEATH) IMPLANT
WATER STERILE IRR 500ML POUR (IV SOLUTION) ×1 IMPLANT

## 2013-12-06 NOTE — Anesthesia Procedure Notes (Signed)
Procedure Name: LMA Insertion Date/Time: 12/06/2013 2:03 PM Performed by: Jessica PriestBEESON, Birdie Fetty C Pre-anesthesia Checklist: Patient identified, Emergency Drugs available, Suction available and Patient being monitored Patient Re-evaluated:Patient Re-evaluated prior to inductionOxygen Delivery Method: Circle System Utilized Preoxygenation: Pre-oxygenation with 100% oxygen Intubation Type: IV induction Ventilation: Mask ventilation without difficulty LMA: LMA inserted LMA Size: 5.0 Number of attempts: 1 Airway Equipment and Method: bite block Placement Confirmation: positive ETCO2 Tube secured with: Tape Dental Injury: Teeth and Oropharynx as per pre-operative assessment

## 2013-12-06 NOTE — Anesthesia Postprocedure Evaluation (Signed)
  Anesthesia Post-op Note  Patient: Russell Gregory  Procedure(s) Performed: Procedure(s) (LRB): CYSTOSCOPY WITH RETROGRADE PYELOGRAM, URETEROSCOPY AND STONE BASKETRY (Right)  Patient Location: PACU  Anesthesia Type: General  Level of Consciousness: awake and alert   Airway and Oxygen Therapy: Patient Spontanous Breathing  Post-op Pain: mild  Post-op Assessment: Post-op Vital signs reviewed, Patient's Cardiovascular Status Stable, Respiratory Function Stable, Patent Airway and No signs of Nausea or vomiting  Last Vitals:  Filed Vitals:   12/06/13 1240  BP: 147/82  Pulse: 43  Temp: 36.4 C  Resp: 16    Post-op Vital Signs: stable   Complications: No apparent anesthesia complications

## 2013-12-06 NOTE — Transfer of Care (Signed)
Immediate Anesthesia Transfer of Care Note  Patient: Russell Gregory  Procedure(s) Performed: Procedure(s) (LRB): CYSTOSCOPY WITH RETROGRADE PYELOGRAM, URETEROSCOPY AND STONE BASKETRY (Right)  Patient Location: PACU  Anesthesia Type: General  Level of Consciousness: awake, sedated, patient cooperative and responds to stimulation  Airway & Oxygen Therapy: Patient Spontanous Breathing and Patient connected to face mask oxygen  Post-op Assessment: Report given to PACU RN, Post -op Vital signs reviewed and stable and Patient moving all extremities  Post vital signs: Reviewed and stable  Complications: No apparent anesthesia complications

## 2013-12-06 NOTE — H&P (View-Only) (Signed)
Active Problems Problems  1. Calculus of ureter (592.1) 2. Microscopic hematuria (599.72) 3. Nephrolithiasis (592.0)  History of Present Illness Russell Gregory is a 43 yo WM former patient of Dr. Borden who was last seen for a vasectomy in 2009. He returns today with a one month history of dysuria and frequency. The saw Dr. Wong who gave him antibiotics which helped for a few days and then the symptoms returned.  He has a good stream. He has no nocturia. He has had no hematuria.  He has intermittent urgency. He has had some suprapubic discomfort over the last couple of days but he has had no flank pain. He has had prior stones and he passed one 5 years ago but was told he still had a stone on a films a couple of years ago.  He has 3-6 RBC's on the UA today.   Past Medical History Problems  1. History of Encounter for contraceptive planning (V25.09) 2. History of arthritis (V13.4) 3. History of esophageal reflux (V12.79) 4. History of hypercholesterolemia (V12.29) 5. History of hypertension (V12.59) 6. History of kidney stones (V13.01)  Surgical History Problems  1. History of Hernia Repair 2. History of Surgery Of Male Genitalia Vasectomy  Current Meds 1. Ambien TABS;  Therapy: (Recorded:10Sep2015) to Recorded 2. Tarka 2-240 MG Oral Tablet Extended Release;  Therapy: (Recorded:10Sep2015) to Recorded  He is on a NSAID chronically.   Allergies Medication  1. No Known Drug Allergies  Family History Problems  1. No pertinent family history : Mother  Social History Problems    Alcohol Use   1 glass per day   Never a smoker   Occupation   Operator   Denied: Tobacco Use  Review of Systems Genitourinary, constitutional, skin, eye, otolaryngeal, hematologic/lymphatic, cardiovascular, pulmonary, endocrine, musculoskeletal, gastrointestinal, neurological and psychiatric system(s) were reviewed and pertinent findings if present are noted.  Genitourinary: urinary frequency  and dysuria.    Vitals Vital Signs [Data Includes: Last 1 Day]  Recorded: 10Sep2015 01:34PM  Height: 5 ft 11 in Weight: 180 lb  BMI Calculated: 25.11 BSA Calculated: 2.02 Blood Pressure: 125 / 87 Temperature: 98.1 F Heart Rate: 56  Physical Exam Constitutional: Well nourished and well developed . No acute distress.  Pulmonary: No respiratory distress and normal respiratory rhythm and effort.  Cardiovascular: Heart rate and rhythm are normal . No peripheral edema.  Abdomen: The abdomen is soft and nontender. No masses are palpated. No CVA tenderness. No hernias are palpable. No hepatosplenomegaly noted.    Results/Data Urine [Data Includes: Last 1 Day]   10Sep2015  COLOR YELLOW   APPEARANCE CLEAR   SPECIFIC GRAVITY 1.025   pH 5.5   GLUCOSE NEG mg/dL  BILIRUBIN MOD   KETONE NEG mg/dL  BLOOD TRACE   PROTEIN NEG mg/dL  UROBILINOGEN 0.2 mg/dL  NITRITE NEG   LEUKOCYTE ESTERASE NEG   SQUAMOUS EPITHELIAL/HPF RARE   WBC 0-2 WBC/hpf  RBC 3-6 RBC/hpf  BACTERIA RARE   CRYSTALS NONE SEEN   CASTS NONE SEEN    The following images/tracing/specimen were independently visualized:  KUB today shows a possible 2mm LLP stone. I don't see any right renal stones or obvious ureteral stones. There is gas and stool in the rectum which could obscure a distal stone. He has otherwise normal bones, gas patterns and soft tissue shadows. I have reviewed his CT from 2013 and he has a small stone in each kidney.  The following clinical lab reports were reviewed:  UA reviewed.      Assessment Assessed  1. Microscopic hematuria (599.72) 2. Calculus of ureter (592.1)  He has irritative voiding symptoms with some suprapubic pain and microhematuria that is probably secondary to a small ureteral stone.   Plan Calculus of ureter  1. Start: Tamsulosin HCl - 0.4 MG Oral Capsule; TAKE 1 CAPSULE Daily 2. Start: Uribel 118 MG Oral Capsule; take 1 capsule   4  times a day prn 3. Follow-up MD/NP/PA Office   Follow-up  Status: Hold For - Appointment,Date of Service   Requested for: 10Sep2015 Health Maintenance  4. UA With REFLEX; [Do Not Release]; Status:Resulted - Requires Verification;   Done:  10Sep2015 01:19PM Microscopic hematuria  5. KUB; Status:Resulted - Requires Verification;   Done: 10Sep2015 12:00AM  I am going to start him on tamsulosin and reviewed teh side effects.  I have given him uribel samples for the irritation and he is not having pain that is enough to require pain meds.  He will return in 3-4 weeks and if he still has symptoms or hematuria, he will need a CT and possible cystoscopy.   Discussion/Summary CC: Dr. Leodis SiasFrancis Wong.   He was back in today with persistent symptoms and a CT urogram shows a 4mm RUVJ stone.   We will proceed with ureteroscopic extraction later today.

## 2013-12-06 NOTE — Interval H&P Note (Signed)
History and Physical Interval Note:  He remains symptomatic.   12/06/2013 1:48 PM  Russell Gregory  has presented today for surgery, with the diagnosis of rt distal ureteral stone  The various methods of treatment have been discussed with the patient and family. After consideration of risks, benefits and other options for treatment, the patient has consented to  Procedure(s) with comments: CYSTOSCOPY WITH RETROGRADE PYELOGRAM, URETEROSCOPY AND STENT PLACEMENT (Right) - cysto, right retrograde, right ureteroscopy, possible stent   c arm laser  as a surgical intervention .  The patient's history has been reviewed, patient examined, no change in status, stable for surgery.  I have reviewed the patient's chart and labs.  Questions were answered to the patient's satisfaction.     Tahtiana Rozier J

## 2013-12-06 NOTE — Discharge Instructions (Signed)
°  Cystoscopy, Care After °Refer to this sheet in the next few weeks. These instructions provide you with information on caring for yourself after your procedure. Your caregiver may also give you more specific instructions. Your treatment has been planned according to current medical practices, but problems sometimes occur. Call your caregiver if you have any problems or questions after your procedure. °HOME CARE INSTRUCTIONS  °Things you can do to ease any discomfort after your procedure include: °· Drinking enough water and fluids to keep your urine clear or pale yellow. °· Taking a warm bath to relieve any burning feelings. °SEEK IMMEDIATE MEDICAL CARE IF:  °· You have an increase in blood in your urine. °· You notice blood clots in your urine. °· You have difficulty passing urine. °· You have the chills. °· You have abdominal pain. °· You have a fever or persistent symptoms for more than 2-3 days. °· You have a fever and your symptoms suddenly get worse. °MAKE SURE YOU:  °· Understand these instructions. °· Will watch your condition. °· Will get help right away if you are not doing well or get worse. °Document Released: 08/30/2004 Document Revised: 10/13/2012 Document Reviewed: 08/04/2011 °ExitCare® Patient Information ©2015 [employer]. This information is not intended to replace advice given to you by your health care provider. Make sure you discuss any questions you have with your health care provider. ° °Post Anesthesia Home Care Instructions ° °Activity: °Get plenty of rest for the remainder of the day. A responsible adult should stay with you for 24 hours following the procedure.  °For the next 24 hours, DO NOT: °-Drive a car °-Operate machinery °-Drink alcoholic beverages °-Take any medication unless instructed by your physician °-Make any legal decisions or sign important papers. ° °Meals: °Start with liquid foods such as gelatin or soup. Progress to regular foods as tolerated. Avoid greasy, spicy,  heavy foods. If nausea and/or vomiting occur, drink only clear liquids until the nausea and/or vomiting subsides. Call your physician if vomiting continues. ° °Special Instructions/Symptoms: °Your throat may feel dry or sore from the anesthesia or the breathing tube placed in your throat during surgery. If this causes discomfort, gargle with warm salt water. The discomfort should disappear within 24 hours. ° °

## 2013-12-06 NOTE — Brief Op Note (Signed)
12/06/2013  2:16 PM  PATIENT:  Russell Gregory  43 y.o. male  PRE-OPERATIVE DIAGNOSIS:  rt distal ureteral stone  POST-OPERATIVE DIAGNOSIS:  rt distal ureteral stone  PROCEDURE:  Procedure(s): CYSTOSCOPY WITH RETROGRADE PYELOGRAM, URETEROSCOPY AND STONE BASKETRY (Right)  SURGEON:  Surgeon(s) and Role:    * Anner CreteJohn J Lisaanne Lawrie, MD - Primary  PHYSICIAN ASSISTANT:   ASSISTANTS: none   ANESTHESIA:   general  EBL:  Total I/O In: 100 [I.V.:100] Out: -   BLOOD ADMINISTERED:none  DRAINS: none   LOCAL MEDICATIONS USED:  LIDOCAINE   SPECIMEN:  Source of Specimen:  right distal stone  DISPOSITION OF SPECIMEN:  to family  COUNTS:  YES  TOURNIQUET:  * No tourniquets in log *  DICTATION: .Other Dictation: Dictation Number [id number]  PLAN OF CARE: Discharge to home after PACU  PATIENT DISPOSITION:  PACU - hemodynamically stable.   Delay start of Pharmacological VTE agent (>24hrs) due to surgical blood loss or risk of bleeding: not applicable

## 2013-12-07 NOTE — Op Note (Signed)
NAME:  [name], [name]              ACCOUNT NO.:  [id number]  MEDICAL RECORD NO.:  [id number]  LOCATION:                                 FACILITY:  PHYSICIAN:  Excell SeltzerJohn J. Annabell HowellsWrenn, M.D.    DATE OF BIRTH:  [birth year]  DATE OF PROCEDURE:  12/06/2013 DATE OF DISCHARGE:  12/06/2013                              OPERATIVE REPORT   PROCEDURE:  Cystoscopy with right retrograde pyelogram and interpretation right ureteroscopic stone.  PREOPERATIVE DIAGNOSIS:  Right distal ureteral stone.  POSTOPERATIVE DIAGNOSIS:  Right distal ureteral stone.  SURGEON:  Excell SeltzerJohn J. Annabell HowellsWrenn, M.D.  ANESTHESIA:  General.  SPECIMEN:  Stone.  DRAIN:  None.  BLOOD LOSS:  None.  COMPLICATIONS:  None.  INDICATIONS:  [name] is a 43 year old white male who has a 3 mm right distal ureteral stone that has failed to progress.  He continues to have irritative voiding symptoms.  It was felt that ureteroscopic extraction was indicated.  FINDINGS AND PROCEDURE:  He was taken to the operating room where general anesthetic was induced.  He was placed in the lithotomy position.  He received 2 g Ancef and was fitted with PAS hose.  His perineum and genitalia were prepped with Betadine solution and he was draped in usual sterile fashion.  Cystoscopy was performed using a 22-French scope and 12-degree lens. Examination revealed a normal urethra.  The external sphincter was intact.  The prostatic urethra was approximately 3 cm in length.  The bilobar hyperplasia has minimal obstruction.  Examination of the bladder revealed mild trabeculation.  No tumors or stones were noted.  The ureteral orifices were unremarkable.  The right ureteral orifice was cannulated with 5-French open-end catheter and contrast was instilled.  This demonstrated a very faint filling defect in the distal ureter consistent with a stone.  There was no hydronephrosis in the distal ureter appeared delicate.  Once the retrograde pyelogram was  complete, a guidewire was passed to the kidney and the open-end catheter was removed.  The cystoscope was removed and a 12-French introducer sheath and inner core was used to dilate the distal ureter.  It passed very easily.  A 6.4-French and 6.5-French semirigid ureteroscope was then passed alongside the wire.  This easily accessed the ureter and the stone was identified without difficulty.  It was grasped with a Nitinol basket and removed.  There was minimal ureteral trauma.  It was not felt the stent was indicated.  The cystoscope was reinserted over the wire.  The wire was removed.  The bladder was drained.  The cystoscope was removed.  The urethra was instilled with 2% lidocaine, a 10 mL of 2% lidocaine jelly. The patient was taken down from lithotomy position.  His anesthetic was reversed.  He was moved to recovery room in stable condition.  There were no complications.     Excell SeltzerJohn J. Annabell HowellsWrenn, M.D.     JJW/MEDQ  D:  12/06/2013  T:  12/06/2013  Job:  [id number]

## 2013-12-08 ENCOUNTER — Encounter (HOSPITAL_BASED_OUTPATIENT_CLINIC_OR_DEPARTMENT_OTHER): Payer: Self-pay | Admitting: Urology

## 2014-02-13 ENCOUNTER — Encounter: Payer: Self-pay | Admitting: Internal Medicine

## 2014-02-28 ENCOUNTER — Encounter: Payer: Self-pay | Admitting: Internal Medicine

## 2014-02-28 ENCOUNTER — Ambulatory Visit (INDEPENDENT_AMBULATORY_CARE_PROVIDER_SITE_OTHER)
Admission: RE | Admit: 2014-02-28 | Discharge: 2014-02-28 | Disposition: A | Discharge status: Home / Self Care | Payer: BLUE CROSS/BLUE SHIELD | Source: Ambulatory Visit | Attending: Internal Medicine | Admitting: Internal Medicine

## 2014-02-28 ENCOUNTER — Ambulatory Visit (INDEPENDENT_AMBULATORY_CARE_PROVIDER_SITE_OTHER): Payer: BLUE CROSS/BLUE SHIELD | Admitting: Internal Medicine

## 2014-02-28 VITALS — BP 122/72 | HR 64 | Ht 71.0 in | Wt 192.5 lb

## 2014-02-28 DIAGNOSIS — R0789 Other chest pain: Secondary | ICD-10-CM

## 2014-02-28 NOTE — Progress Notes (Signed)
Patient ID: Russell Gregory, male   DOB: 05/09/1970, 44 y.o.   MRN: [id number] HPI: Russell Gregory is a 44 yo male with PMH of GERD, hypertension, hyperlipidemia, kidney stones who is seen to evaluate atypical chest pain on recommendation after an ER visit. He is here alone today. He reports he developed pressure in his mid chest after Thanksgiving when he 14. This persisted and was a fairly constant pressure worse after eating for 3-4 weeks. This lead to an ER visit on 02/12/2014. [facility].  He reports that the pressure which is now slightly better was more present after eating but also with activity. He reports the pressure would "build up" with activity and didn't seem to bother him at rest. He denies typical heartburn which he has had in the past. He reports he hadn't esophageal dilation in 2000 [place]. [place]elle [place]bs [place]. He denies nausea or vomiting. Reports good appetite. No early satiety or weight loss. The pain was associated with some dyspnea but it does not radiate. At the ER he had a 1 view chest x-ray and EKG which were unremarkable. Labs were also unremarkable. He was diagnosed with atypical chest pain and given a prescription for Nexium and Carafate. He has been taking Nexium and Carafate daily and he feels like the pain may be slightly better. No cough. No upper back pain. He reports feeling like the pressure or pain "breaks up and feels like it is popping".  He denies a family history of GI tract malignancy.  Past Medical History  Diagnosis Date  . Inguinal hernia   . Hyperlipidemia   . Hypertension   . GERD (gastroesophageal reflux disease)   . Right ureteral stone   . Dysuria-frequency syndrome   . Insomnia     Past Surgical History  Procedure Laterality Date  . Tonsillectomy    . Hernia repair      left inguinal repair  . Cystoscopy with retrograde pyelogram, ureteroscopy and stent placement Right 12/06/2013    Procedure: CYSTOSCOPY WITH RETROGRADE  PYELOGRAM, URETEROSCOPY AND STONE BASKETRY;  Surgeon: Anner CreteJohn J Wrenn, MD;  [facility];  Service: Urology;  Laterality: Right;    Outpatient Prescriptions Prior to Visit  Medication Sig Dispense Refill  . esomeprazole (NEXIUM) 40 MG capsule Take 40 mg by mouth daily at 12 noon.    . etodolac (LODINE) 400 MG tablet Take 400 mg by mouth daily.    . pravastatin (PRAVACHOL) 10 MG tablet Take 10 mg by mouth daily.    . trandolapril-verapamil (TARKA) 4-240 MG per tablet TAKE 1 TABLET EVERY DAY 90 tablet 3  . zolpidem (AMBIEN) 10 MG tablet TAKE 1 TABLET BY MOUTH AT BEDTIME 30 tablet 3  . Omega-3 Fatty Acids (FISH OIL) 1000 MG CAPS Take 2,000 mg by mouth.    . phenazopyridine (PYRIDIUM) 200 MG tablet Take 1 tablet (200 mg total) by mouth 3 (three) times daily as needed for pain. 15 tablet 1  . Soybean Lecithin (LECITHIN SOYA) POWD 2 scoop by Does not apply route.    . traMADol (ULTRAM) 50 MG tablet Take 1 tablet (50 mg total) by mouth every 6 (six) hours as needed. 15 tablet 0   No facility-administered medications prior to visit.    Allergies  Allergen Reactions  . Morphine And Related Nausea And Vomiting    Family History  Problem Relation Age of Onset  . Heart attack Paternal Grandfather   . Colon cancer Neg  Hx   . Colon polyps Neg Hx   . Kidney disease Neg Hx   . Esophageal cancer Neg Hx   . Diabetes Neg Hx   . Gallbladder disease Neg Hx     History  Substance Use Topics  . Smoking status: Never Smoker   . Smokeless tobacco: Never Used  . Alcohol Use: No    ROS: As per history of present illness, otherwise negative  BP 122/72 mmHg  Pulse 64  Ht  (1.803 m)  Wt 192 lb 8 oz (87.317 kg)  BMI 26.86 kg/m2 Constitutional: Well-developed and well-nourished. No distress. HEENT: Normocephalic and atraumatic. Oropharynx is clear and moist. No oropharyngeal exudate. Conjunctivae are normal.  No scleral icterus. Neck: Neck supple. Trachea  midline. Cardiovascular: Normal rate, regular rhythm and intact distal pulses. No M/R/G Pulmonary/chest: Effort normal and breath sounds normal. No wheezing, rales or rhonchi. Abdominal: Soft, nontender, nondistended. Bowel sounds active throughout. There are no masses palpable. No hepatosplenomegaly. Extremities: no clubbing, cyanosis, or edema Lymphadenopathy: No cervical adenopathy noted. Neurological: Alert and oriented to person place and time. Skin: Skin is warm and dry. No rashes noted. Psychiatric: Normal mood and affect. Behavior is normal.  RELEVANT LABS AND IMAGING: CBC    Component Value Date/Time   WBC 6.1 03/24/2011 0019   RBC 4.94 03/24/2011 0019   HGB 15.3 12/06/2013 1312   HCT 45.0 12/06/2013 1312   PLT 185 03/24/2011 0019   MCV 91.3 03/24/2011 0019   MCH 32.2 03/24/2011 0019   MCHC 35.3 03/24/2011 0019   RDW 12.6 03/24/2011 0019    CMP     Component Value Date/Time   NA 135* 12/06/2013 1312   NA 138 06/30/2013 0902   K 4.4 12/06/2013 1312   CL 99 06/30/2013 0902   CO2 25 06/30/2013 0902   GLUCOSE 84 12/06/2013 1312   GLUCOSE 85 06/30/2013 0902   BUN 19 06/30/2013 0902   BUN 15 07/23/2012 1059   CREATININE 1.00 06/30/2013 0902   CREATININE 1.03 07/23/2012 1059   CALCIUM 9.3 06/30/2013 0902   PROT 7.0 06/30/2013 0902   PROT 7.3 07/23/2012 1059   ALBUMIN 4.9 07/23/2012 1059   AST 16 06/30/2013 0902   ALT 15 06/30/2013 0902   ALKPHOS 59 06/30/2013 0902   BILITOT 0.5 06/30/2013 0902   GFRNONAA 92 06/30/2013 0902   GFRNONAA 89 07/23/2012 1059   GFRAA 106 06/30/2013 0902   GFRAA >89 07/23/2012 1059   CXR - normal from ED visit Labs from 02/12/2014 - CMP within normal limits except for mild elevation in glucose at 108. W BC 4.8, hemoglobin 15.4, MCV 95, platelet count 224, troponin negative, d-dimer negative, lipase normal BNP normal EKG sinus rhythm with first-degree AV block otherwise normal  ASSESSMENT/PLAN: 44 yo male with PMH of GERD,  hypertension, hyperlipidemia, kidney stones who is seen to evaluate atypical chest pain on recommendation after an ER visit.   1. Atypical CP -- his chest pain is not classic for GERD though his symptoms seem to have improved slightly with Nexium. He is not having traditional heartburn and has a history of such in the past. Given the association with exertion and dyspnea I have recommended cardiology referral for evaluation of cardiac chest pain. Repeat 2 view chest x-ray today. Continue Nexium 20 g daily. If this workup is negative would recommended proceeding to EGD. The test was discussed including the risks and benefits and he is agreeable to proceed. If upper endoscopy is normal and discomfort persists  we could consider esophageal manometry or cross-sectional imaging. He understands and is happy with this plan. The pain worsens or changes he is asked to notify me or primary care immediately. He voices understanding

## 2014-02-28 NOTE — Patient Instructions (Signed)
Please go directly to the basement for your chest x-ray.   Continue Nexium 20 mg over the counter once daily.   You have been scheduled for an endoscopy. Please follow written instructions given to you at your visit today. If you use inhalers (even only as needed), please bring them with you on the day of your procedure.  We have [facility] Dr. Delton SeeNelson on 03/03/14 at 11:00am. Please arrive at 10:45 for registration at [address]. If you need to reschedule or cancel please call there office at [phone].  cc: Leodis SiasFrancis Wong, MD

## 2014-03-03 ENCOUNTER — Encounter: Payer: Self-pay | Admitting: Cardiology

## 2014-03-03 ENCOUNTER — Ambulatory Visit (INDEPENDENT_AMBULATORY_CARE_PROVIDER_SITE_OTHER): Payer: BLUE CROSS/BLUE SHIELD | Admitting: Cardiology

## 2014-03-03 VITALS — BP 160/92 | HR 54 | Ht 71.0 in | Wt 192.0 lb

## 2014-03-03 DIAGNOSIS — R079 Chest pain, unspecified: Secondary | ICD-10-CM

## 2014-03-03 DIAGNOSIS — E785 Hyperlipidemia, unspecified: Secondary | ICD-10-CM

## 2014-03-03 DIAGNOSIS — I10 Essential (primary) hypertension: Secondary | ICD-10-CM

## 2014-03-03 NOTE — Progress Notes (Signed)
Patient ID: Pearletha Alfredimothy M Krise, male   DOB: 11/05/1970, 44 y.o.   MRN: [id number]     Patient Name: Pearletha Alfredimothy M Hendler Date of Encounter: 03/03/2014  Primary Care Provider:  Redmond BasemanWONG,FRANCIS PATRICK, MD Primary Cardiologist: Lars MassonNELSON, Minda Faas H   Problem List   Past Medical History  Diagnosis Date  . Inguinal hernia   . Hyperlipidemia   . Hypertension   . GERD (gastroesophageal reflux disease)   . Right ureteral stone   . Dysuria-frequency syndrome   . Insomnia    Past Surgical History  Procedure Laterality Date  . Tonsillectomy    . Hernia repair      left inguinal repair  . Cystoscopy with retrograde pyelogram, ureteroscopy and stent placement Right 12/06/2013    Procedure: CYSTOSCOPY WITH RETROGRADE PYELOGRAM, URETEROSCOPY AND STONE BASKETRY;  Surgeon: Anner CreteJohn J Wrenn, MD;  [facility];  Service: Urology;  Laterality: Right;    Allergies  Allergies  Allergen Reactions  . Morphine And Related Nausea And Vomiting    HPI  Aldean Astim Qin is a 44 yo male with PMH of GERD, hypertension, hyperlipidemia, kidney stones who was seen in the ER the last couple months twice for a chest pain that can be happening into different situations. Patient claims he walks 12 miles a day and on moderate exertion he would feel retrosternal chest pain. 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 Lately he has been experiencing a lot of chest pain that start few hours after he eats. He feels okay in the morning. He denies any palpitations or syncope. Denies any shortness of breath. His grandfather had heart attack at age of 45 and died of a he was a smoker. His one brother and parents have no cardiac problem. He was diagnosed with elevated LDL of 142 in May of last year and was started on pravastatin that he is compliant with. He denies any claudications or syncope. No orthopnea or proximal nocturnal dyspnea. His  blood pressure is elevated today however he states it's usually normal.  Home Medications  Prior to Admission medications   Medication Sig Start Date End Date Taking? Authorizing Provider  esomeprazole (NEXIUM) 40 MG capsule Take 40 mg by mouth. 02/12/14 02/12/15 Yes Historical Provider, MD  etodolac (LODINE) 400 MG tablet Take 400 mg by mouth daily. 06/10/13  Yes Historical Provider, MD  pravastatin (PRAVACHOL) 10 MG tablet Take 10 mg by mouth daily.   Yes Historical Provider, MD  sucralfate (CARAFATE) 1 G tablet Take 1 g by mouth. 02/12/14 02/12/15 Yes Historical Provider, MD  trandolapril-verapamil (TARKA) 4-240 MG per tablet TAKE 1 TABLET EVERY DAY 03/01/13  Yes Ileana LaddFrancis P Wong, MD  zolpidem (AMBIEN) 10 MG tablet TAKE 1 TABLET BY MOUTH AT BEDTIME 06/30/13  Yes Ileana LaddFrancis P Wong, MD    Family History  Family History  Problem Relation Age of Onset  . Heart attack Paternal Grandfather   . Colon cancer Neg Hx   . Colon polyps Neg Hx   . Kidney disease Neg Hx   . Esophageal cancer Neg Hx   . Diabetes Neg Hx   . Gallbladder disease Neg Hx     Social History  History   Social History  . Marital Status: Married    Spouse Name: N/A    Number of Children: 0  . 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 Smoking status: Never Smoker   . Smokeless tobacco: Never Used  . Alcohol Use: No  . Drug Use: No  . Sexual Activity: Not on file   Other Topics Concern  . Not on file   Social History Narrative     Review of Systems, as per HPI, otherwise negative General:  No chills, fever, night sweats or weight changes.  Cardiovascular:  No chest pain, dyspnea on exertion, edema, orthopnea, palpitations, paroxysmal nocturnal dyspnea. Dermatological: No rash, lesions/masses Respiratory: No cough, dyspnea Urologic: No hematuria, dysuria Abdominal:   No nausea, vomiting, diarrhea, bright red blood per rectum, melena, or  hematemesis Neurologic:  No visual changes, wkns, changes in mental status. All other systems reviewed and are otherwise negative except as noted above.  Physical Exam  Blood pressure 160/92, pulse 54, height  (1.803 m), weight 192 lb (87.091 kg).  General: Pleasant, NAD Psych: Normal affect. Neuro: Alert and oriented X 3. Moves all extremities spontaneously. HEENT: Normal  Neck: Supple without bruits or JVD. Lungs:  Resp regular and unlabored, CTA. Heart: RRR no s3, s4, or murmurs. Abdomen: Soft, non-tender, non-distended, BS + x 4.  Extremities: No clubbing, cyanosis or edema. DP/PT/Radials 2+ and equal bilaterally.  Labs:  No results for input(s): CKTOTAL, CKMB, TROPONINI in the last 72 hours. Lab Results  Component Value Date   WBC 6.1 03/24/2011   HGB 15.3 12/06/2013   HCT 45.0 12/06/2013   MCV 91.3 03/24/2011   PLT 185 03/24/2011    No results found for: DDIMER Invalid input(s): POCBNP    Component Value Date/Time   NA 135* 12/06/2013 1312   NA 138 06/30/2013 0902   K 4.4 12/06/2013 1312   CL 99 06/30/2013 0902   CO2 25 06/30/2013 0902   GLUCOSE 84 12/06/2013 1312   GLUCOSE 85 06/30/2013 0902   BUN 19 06/30/2013 0902   BUN 15 07/23/2012 1059   CREATININE 1.00 06/30/2013 0902   CREATININE 1.03 07/23/2012 1059   CALCIUM 9.3 06/30/2013 0902   PROT 7.0 06/30/2013 0902   PROT 7.3 07/23/2012 1059   ALBUMIN 4.9 07/23/2012 1059   AST 16 06/30/2013 0902   ALT 15 06/30/2013 0902   ALKPHOS 59 06/30/2013 0902   BILITOT 0.5 06/30/2013 0902   GFRNONAA 92 06/30/2013 0902   GFRNONAA 89 07/23/2012 1059   GFRAA 106 06/30/2013 0902   GFRAA >89 07/23/2012 1059   Lab Results  Component Value Date   CHOL 169 10/29/2012   HDL 39* 06/30/2013   LDLCALC 142* 06/30/2013   TRIG 129 06/30/2013    Accessory Clinical Findings  Echocardiogram - none  ECG - sinus bradycardia with first-degree AV block, 54 bpm, otherwise normal EKG.    Assessment &  Plan  44 year old gentleman  1. Chest pain with some very atypical features that seem to be related to GI system but also typical chest pain related to exertion. His baseline EKG has no ischemic changes and therefore we will order an exercise treadmill stress test.  2. Hypertension - the patient states that he is usually normal however his already on blood pressure medicine and still hypertensive. We will reevaluate on a daily stress test and also response to stress. We will also order an echocardiogram to evaluate for diastolic function and degree of LVH and adjust medication based on the results.  3. Hyperlipidemia - with LDL 142 in May of last year starting pravastatin.  Follow up in 2 months.     Wilhelm Ganaway,  Faustino Congress, MD, [facility] 03/03/2014, 11:36 AM

## 2014-03-03 NOTE — Patient Instructions (Signed)
Your physician recommends that you continue on your current medications as directed. Please refer to the Current Medication list given to you today.  Your physician has requested that you have an echocardiogram. Echocardiography is a painless test that uses sound waves to create images of your heart. It provides your doctor with information about the size and shape of your heart and how well your heart's chambers and valves are working. This procedure takes approximately one hour. There are no restrictions for this procedure.  Your physician has requested that you have an exercise tolerance test. For further information please visit [email or web address]. Please also follow instruction sheet, as given.  Your physician recommends that you schedule a follow-up appointment in: 2 months with Dr. Delton SeeNelson.

## 2014-03-07 ENCOUNTER — Encounter: Payer: Self-pay | Admitting: Internal Medicine

## 2014-03-09 ENCOUNTER — Encounter (HOSPITAL_BASED_OUTPATIENT_CLINIC_OR_DEPARTMENT_OTHER): Payer: Self-pay | Admitting: Urology

## 2014-03-14 ENCOUNTER — Other Ambulatory Visit: Payer: Self-pay | Admitting: *Deleted

## 2014-03-14 ENCOUNTER — Ambulatory Visit (AMBULATORY_SURGERY_CENTER): Payer: BLUE CROSS/BLUE SHIELD | Admitting: Internal Medicine

## 2014-03-14 ENCOUNTER — Encounter: Payer: Self-pay | Admitting: Internal Medicine

## 2014-03-14 VITALS — BP 118/71 | HR 43 | Temp 97.7°F | Resp 22 | Ht 71.0 in | Wt 192.0 lb

## 2014-03-14 DIAGNOSIS — K2990 Gastroduodenitis, unspecified, without bleeding: Secondary | ICD-10-CM

## 2014-03-14 DIAGNOSIS — K219 Gastro-esophageal reflux disease without esophagitis: Secondary | ICD-10-CM

## 2014-03-14 DIAGNOSIS — R0789 Other chest pain: Secondary | ICD-10-CM

## 2014-03-14 DIAGNOSIS — K208 Other esophagitis: Secondary | ICD-10-CM

## 2014-03-14 DIAGNOSIS — K2970 Gastritis, unspecified, without bleeding: Secondary | ICD-10-CM

## 2014-03-14 DIAGNOSIS — K2950 Unspecified chronic gastritis without bleeding: Secondary | ICD-10-CM

## 2014-03-14 MED ORDER — ESOMEPRAZOLE MAGNESIUM 40 MG PO CPDR: 40.0000 mg | DELAYED_RELEASE_CAPSULE | Freq: Every day | ORAL | Status: DC

## 2014-03-14 MED ORDER — SODIUM CHLORIDE 0.9 % IV SOLN: 500.0000 mL | INTRAVENOUS | Status: DC

## 2014-03-14 MED ORDER — ESOMEPRAZOLE MAGNESIUM 40 MG PO CPDR
40.0000 mg | DELAYED_RELEASE_CAPSULE | Freq: Every day | ORAL | Status: DC
Start: 2014-03-14 — End: 2019-06-07

## 2014-03-14 NOTE — Progress Notes (Signed)
A/ox3 pleased with MAC, report to Suzanne RN 

## 2014-03-14 NOTE — Op Note (Signed)
[facility] [address] [place], [id number]   ENDOSCOPY PROCEDURE REPORT  PATIENT: Russell Gregory, Russell Gregory  MR#: [id number] BIRTHDATE: [birth year] , 43  yrs. old GENDER: male ENDOSCOPIST: Beverley FiedlerJay Gregory Pyrtle, MD REFERRED BY:  Leodis SiasFrancis Wong, Gregory.D. PROCEDURE DATE:  03/14/2014 PROCEDURE:  EGD w/ biopsy ASA CLASS:     Class II INDICATIONS:  chest pain and history of GERD. MEDICATIONS: Monitored anesthesia care and Propofol 250 mg IV TOPICAL ANESTHETIC: none  DESCRIPTION OF PROCEDURE: After the risks benefits and alternatives of the procedure were thoroughly explained, informed consent was obtained.  The LB [id number] endoscope was introduced through the mouth and advanced to the second portion of the duodenum , Without limitations.  The instrument was slowly withdrawn as the mucosa was fully examined.   ESOPHAGUS: The z-line was located 40cm from the incisors.  The z line appeared irregular and biopsies were taken.   The esophagus was otherwise normal.  STOMACH: A 2 cm hiatal hernia was noted.   Acute striped gastritis (inflammation) was found in the gastric antrum.  Cold forcep biopsies were taken at the gastric body, antrum and angularis to evaluate for h.  pylori.  Proximal stomach normal in appearance.  DUODENUM: Mild duodenal inflammation was found in the duodenal bulb. Normal examined 2nd portion of duodenum.  Retroflexed views revealed no abnormalities.     The scope was then withdrawn from the patient and the procedure completed.  COMPLICATIONS: There were no immediate complications.     ENDOSCOPIC IMPRESSION: 1.   The z-line was located 40cm from the incisors 2.   The esophagus was otherwise normal. 3.   Small, 2 cm, hiatal hernia 4.   Acute gastritis (inflammation) was found in the gastric antrum; multiple biopsies 5.   Duodenal inflammation was found in the duodenal bulb,normal examined second portion of the duodenum  RECOMMENDATIONS: 1.  Await  biopsy results 2.  Increase Nexium to 40 mg daily 3.  Await results of stress testing 4.  Office follow-up thereafter   eSigned:  Beverley FiedlerJay Gregory Pyrtle, MD 03/14/2014 9:35 AM    HQ:IONGCC:Wong, Scarlette CalicoFrances MD and The Patient  PATIENT NAME:  Russell Gregory, Russell Gregory MR#: [id number]

## 2014-03-14 NOTE — Patient Instructions (Signed)

## 2014-03-15 ENCOUNTER — Telehealth: Payer: Self-pay

## 2014-03-15 NOTE — Telephone Encounter (Signed)
No answer, left vm 

## 2014-03-21 ENCOUNTER — Encounter: Payer: Self-pay | Admitting: Internal Medicine

## 2014-03-23 ENCOUNTER — Other Ambulatory Visit (HOSPITAL_COMMUNITY): Payer: BLUE CROSS/BLUE SHIELD

## 2014-04-05 ENCOUNTER — Other Ambulatory Visit (HOSPITAL_COMMUNITY): Payer: BLUE CROSS/BLUE SHIELD

## 2014-04-06 ENCOUNTER — Ambulatory Visit (HOSPITAL_COMMUNITY): Payer: BLUE CROSS/BLUE SHIELD | Attending: Cardiology | Admitting: Radiology

## 2014-04-06 DIAGNOSIS — I10 Essential (primary) hypertension: Secondary | ICD-10-CM | POA: Diagnosis not present

## 2014-04-06 DIAGNOSIS — R079 Chest pain, unspecified: Secondary | ICD-10-CM | POA: Diagnosis not present

## 2014-04-06 NOTE — Progress Notes (Signed)
Echocardiogram performed.  

## 2014-04-20 ENCOUNTER — Ambulatory Visit: Payer: BLUE CROSS/BLUE SHIELD | Admitting: Physician Assistant

## 2014-04-20 ENCOUNTER — Ambulatory Visit (INDEPENDENT_AMBULATORY_CARE_PROVIDER_SITE_OTHER): Payer: BLUE CROSS/BLUE SHIELD | Admitting: Physician Assistant

## 2014-04-20 DIAGNOSIS — R079 Chest pain, unspecified: Secondary | ICD-10-CM

## 2014-04-20 DIAGNOSIS — I10 Essential (primary) hypertension: Secondary | ICD-10-CM

## 2014-04-20 NOTE — Progress Notes (Signed)
Normal stress test, normal BP response to stress.

## 2014-04-20 NOTE — Progress Notes (Signed)
Pt aware of normal gxt, and normal BP response to stress per Dr Delton SeeNelson.  Pt verbalized understanding.

## 2014-04-20 NOTE — Progress Notes (Signed)
Exercise Treadmill Test  Pre-Exercise Testing Evaluation Rhythm: normal sinus  Rate: 61 bpm     Test  Exercise Tolerance Test Ordering MD: Tobias AlexanderKatarina Nelson, MD  Interpreting MD: Tereso NewcomerScott Eboney Claybrook, PA-C  Unique Test No: 1  Treadmill:  1  Indication for ETT: chest pain - rule out ischemia  Contraindication to ETT: No   Stress Modality: exercise - treadmill  Cardiac Imaging Performed: non   Protocol: standard Bruce - maximal  Max BP:  184/93  Max MPHR (bpm):  176 85% MPR (bpm):  150  MPHR obtained (bpm):  164 % MPHR obtained:  93  Reached 85% MPHR (min:sec):  7:47 Total Exercise Time (min-sec):  9:00  Workload in METS:  10.1 Borg Scale: 15  Reason ETT Terminated:  desired heart rate attained    ST Segment Analysis At Rest: normal ST segments - no evidence of significant ST depression With Exercise: no evidence of significant ST depression  Other Information Arrhythmia:  No Angina during ETT:  absent (0) Quality of ETT:  diagnostic  ETT Interpretation:  normal - no evidence of ischemia by ST analysis  Comments: Good exercise capacity. No chest pain. Normal BP response to exercise. No ST changes to suggest ischemia.   Recommendations: FU with Dr. Tobias AlexanderKatarina Nelson as planned.  Signed,  Tereso NewcomerScott Mario Coronado, PA-C   04/20/2014 9:30 AM

## 2014-05-04 ENCOUNTER — Encounter: Payer: Self-pay | Admitting: Cardiology

## 2014-05-04 ENCOUNTER — Ambulatory Visit (INDEPENDENT_AMBULATORY_CARE_PROVIDER_SITE_OTHER): Payer: BLUE CROSS/BLUE SHIELD | Admitting: Cardiology

## 2014-05-04 VITALS — BP 132/62 | HR 64 | Ht 71.0 in | Wt 191.0 lb

## 2014-05-04 DIAGNOSIS — K2100 Gastro-esophageal reflux disease with esophagitis, without bleeding: Secondary | ICD-10-CM

## 2014-05-04 DIAGNOSIS — I10 Essential (primary) hypertension: Secondary | ICD-10-CM

## 2014-05-04 DIAGNOSIS — R079 Chest pain, unspecified: Secondary | ICD-10-CM

## 2014-05-04 NOTE — Progress Notes (Deleted)
Patient ID: [name], male   DOB: Aug 09, 1970, 44 y.o.   MRN: [id number]     Patient Name: Russell Gregory Date of Encounter: 05/04/2014  Primary Care Provider:  Redmond Baseman, MD Primary Cardiologist: Lars Masson, MD   Problem List   Past Medical History  Diagnosis Date  . Inguinal hernia   . Hyperlipidemia   . Hypertension   . GERD (gastroesophageal reflux disease)   . Right ureteral stone   . Dysuria-frequency syndrome   . Insomnia    Past Surgical History  Procedure Laterality Date  . Tonsillectomy    . Hernia repair      left inguinal repair  . Cystoscopy with retrograde pyelogram, ureteroscopy and stent placement Right 12/06/2013    Procedure: CYSTOSCOPY WITH RETROGRADE PYELOGRAM, URETEROSCOPY AND STONE BASKETRY;  Surgeon: Anner Crete, MD;  Location: [facility];  Service: Urology;  Laterality: Right;    Allergies  Allergies  Allergen Reactions  . Morphine And Related Nausea And Vomiting    HPI  Russell Gregory is a 44 yo male with PMH of GERD, hypertension, hyperlipidemia, kidney stones who was seen in the ER the last couple months twice for a chest pain that can be happening into different situations. Patient claims he walks 12 miles a day and on moderate exertion he would feel retrosternal chest pain. 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 Lately he has been experiencing a lot of chest pain that start few hours after he eats. He feels okay in the morning. He denies any palpitations or syncope. Denies any shortness of breath. His grandfather had heart attack at age of 26 and died of a he was a smoker. His one brother and parents have no cardiac problem. He was diagnosed with elevated LDL of 142 in May of last year and was started on pravastatin that he is compliant with. He denies any claudications or syncope. No orthopnea or proximal nocturnal dyspnea. His  blood pressure is elevated today however he states it's usually normal.  05/04/2014 - the patient is coming after 2 months, he denies any chest pain or SOB, no palpitations or syncope.     Home Medications  Prior to Admission medications   Medication Sig Start Date End Date Taking? Authorizing Provider  esomeprazole (NEXIUM) 40 MG capsule Take 40 mg by mouth. 02/12/14 02/12/15 Yes Historical Provider, MD  etodolac (LODINE) 400 MG tablet Take 400 mg by mouth daily. 06/10/13  Yes Historical Provider, MD  pravastatin (PRAVACHOL) 10 MG tablet Take 10 mg by mouth daily.   Yes Historical Provider, MD  sucralfate (CARAFATE) 1 G tablet Take 1 g by mouth. 02/12/14 02/12/15 Yes Historical Provider, MD  trandolapril-verapamil (TARKA) 4-240 MG per tablet TAKE 1 TABLET EVERY DAY 03/01/13  Yes Ileana Ladd, MD  zolpidem (AMBIEN) 10 MG tablet TAKE 1 TABLET BY MOUTH AT BEDTIME 06/30/13  Yes Ileana Ladd, MD    Family History  Family History  Problem Relation Age of Onset  . Heart attack Paternal Grandfather   . Colon cancer Neg Hx   . Colon polyps Neg Hx   . Kidney disease Neg Hx   . Esophageal cancer Neg Hx   . Diabetes Neg Hx   . Gallbladder disease Neg Hx   . Stomach cancer Neg Hx     Social History  History   Social History  . Marital Status:  Married    Spouse Name: N/A  . Number of Children: 0  . 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 Smoking status: Never Smoker   . Smokeless tobacco: Never Used  . Alcohol Use: No  . Drug Use: No  . Sexual Activity: Not on file   Other Topics Concern  . Not on file   Social History Narrative     Review of Systems, as per HPI, otherwise negative General:  No chills, fever, night sweats or weight changes.  Cardiovascular:  No chest pain, dyspnea on exertion, edema, orthopnea, palpitations, paroxysmal nocturnal dyspnea. Dermatological: No rash, lesions/masses Respiratory: No cough,  dyspnea Urologic: No hematuria, dysuria Abdominal:   No nausea, vomiting, diarrhea, bright red blood per rectum, melena, or hematemesis Neurologic:  No visual changes, wkns, changes in mental status. All other systems reviewed and are otherwise negative except as noted above.  Physical Exam  Blood pressure 132/62, pulse 64, height 5\' 11"  (1.803 m), weight 191 lb (86.637 kg).  General: Pleasant, NAD Psych: Normal affect. Neuro: Alert and oriented X 3. Moves all extremities spontaneously. HEENT: Normal  Neck: Supple without bruits or JVD. Lungs:  Resp regular and unlabored, CTA. Heart: RRR no s3, s4, or murmurs. Abdomen: Soft, non-tender, non-distended, BS + x 4.  Extremities: No clubbing, cyanosis or edema. DP/PT/Radials 2+ and equal bilaterally.  Labs:  No results for input(s): CKTOTAL, CKMB, TROPONINI in the last 72 hours. Lab Results  Component Value Date   WBC 6.1 03/24/2011   HGB 15.3 12/06/2013   HCT 45.0 12/06/2013   MCV 91.3 03/24/2011   PLT 185 03/24/2011    No results found for: DDIMER Invalid input(s): POCBNP    Component Value Date/Time   NA 135* 12/06/2013 1312   NA 138 06/30/2013 0902   K 4.4 12/06/2013 1312   CL 99 06/30/2013 0902   CO2 25 06/30/2013 0902   GLUCOSE 84 12/06/2013 1312   GLUCOSE 85 06/30/2013 0902   BUN 19 06/30/2013 0902   BUN 15 07/23/2012 1059   CREATININE 1.00 06/30/2013 0902   CREATININE 1.03 07/23/2012 1059   CALCIUM 9.3 06/30/2013 0902   PROT 7.0 06/30/2013 0902   PROT 7.3 07/23/2012 1059   ALBUMIN 4.9 07/23/2012 1059   AST 16 06/30/2013 0902   ALT 15 06/30/2013 0902   ALKPHOS 59 06/30/2013 0902   BILITOT 0.5 06/30/2013 0902   GFRNONAA 92 06/30/2013 0902   GFRNONAA 89 07/23/2012 1059   GFRAA 106 06/30/2013 0902   GFRAA >89 07/23/2012 1059   Lab Results  Component Value Date   CHOL 207* 06/30/2013   HDL 39* 06/30/2013   LDLCALC 142* 06/30/2013   TRIG 129 06/30/2013    Accessory Clinical Findings  Echocardiogram -  none  ECG - sinus bradycardia with first-degree AV block, 54 bpm, otherwise normal EKG.  ETT:  ST Segment Analysis At Rest: normal ST segments - no evidence of significant ST depression With Exercise: no evidence of significant ST depression  Other Information Arrhythmia:  No Angina during ETT:  absent (0) Quality of ETT:  diagnostic  ETT Interpretation:  normal - no evidence of ischemia by ST analysis  Comments: Good exercise capacity. No chest pain. Normal BP response to exercise. No ST changes to suggest ischemia.   TTE: 04/06/2014 Left ventricle: The cavity size was normal. Systolic function was normal. The estimated ejection fraction was in the range of 60% to 65%.  Wall motion was normal; there were no regional wall motion abnormalities. Doppler parameters are consistent with abnormal left ventricular relaxation (grade 1 diastolic dysfunction). There was no evidence of elevated ventricular filling pressure by Doppler parameters. - Aortic valve: Trileaflet; normal thickness leaflets. There was mild regurgitation. - Mitral valve: Structurally normal valve. - Left atrium: The atrium was normal in size. - Right ventricle: Systolic function was normal. - Right atrium: The atrium was normal in size. - Tricuspid valve: There was no regurgitation. - Pulmonic valve: Transvalvular velocity was within the normal range. There was no evidence for stenosis. There was no regurgitation. - Pulmonary arteries: Systolic pressure was within the normal range. - Inferior vena cava: The vessel was normal in size. - Pericardium, extracardiac: There was no pericardial effusion.   ETT: 04/20/2014  ETT Interpretation: normal - no evidence of ischemia by ST analysis  Comments: Good exercise capacity. No chest pain. Normal BP response to exercise. No ST changes to suggest ischemia.    Assessment & Plan  44 year old gentleman  1. Chest pain with some very  atypical features that seem to be related to GI system but also typical chest pain related to exertion. His baseline EKG has no ischemic changes, ETT was normal.   2. Hypertension - the patient states that he is usually normal however his already on blood pressure medicine and still hypertensive. The stress test showed normal response to stress. Normal BP today, we will continue th same regimen.  3. Hyperlipidemia - with LDL 142 in May of last year started on pravastatin.  Follow up in 1 year.     Lars Masson, MD, [facility] 05/04/2014, 9:28 AM

## 2014-05-04 NOTE — Patient Instructions (Signed)
Your physician recommends that you continue on your current medications as directed. Please refer to the Current Medication list given to you today.   Your physician wants you to follow-up in: ONE YEAR WITH DR NELSON You will receive a reminder letter in the mail two months in advance. If you don't receive a letter, please call our office to schedule the follow-up appointment.  

## 2014-05-05 DIAGNOSIS — R079 Chest pain, unspecified: Secondary | ICD-10-CM | POA: Insufficient documentation

## 2014-05-05 HISTORY — DX: Chest pain, unspecified: R07.9

## 2014-05-05 NOTE — Progress Notes (Signed)
Patient ID: JAYSION RAMSEYER, male   DOB: Jul 23, 1970, 44 y.o.   MRN: [id number] Patient ID: SYDNEY AZURE, male   DOB: [birth year], 44 y.o.   MRN: [id number]     Patient Name: Russell Gregory Date of Encounter: 05/04/2014  Primary Care Provider:  Redmond Baseman, MD Primary Cardiologist: Lars Masson, MD   Problem List   Past Medical History  Diagnosis Date  . Inguinal hernia   . Hyperlipidemia   . Hypertension   . GERD (gastroesophageal reflux disease)   . Right ureteral stone   . Dysuria-frequency syndrome   . Insomnia    Past Surgical History  Procedure Laterality Date  . Tonsillectomy    . Hernia repair      left inguinal repair  . Cystoscopy with retrograde pyelogram, ureteroscopy and stent placement Right 12/06/2013    Procedure: CYSTOSCOPY WITH RETROGRADE PYELOGRAM, URETEROSCOPY AND STONE BASKETRY;  Surgeon: Anner Crete, MD;  Location: [facility];  Service: Urology;  Laterality: Right;    Allergies  Allergies  Allergen Reactions  . Morphine And Related Nausea And Vomiting    HPI  Russell Gregory is a 44 yo male with PMH of GERD, hypertension, hyperlipidemia, kidney stones who was seen in the ER the last couple months twice for a chest pain that can be happening into different situations. Patient claims he walks 12 miles a day and on moderate exertion he would feel retrosternal chest pain. 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 Lately he has been experiencing a lot of chest pain that start few hours after he eats. He feels okay in the morning. He denies any palpitations or syncope. Denies any shortness of breath. His grandfather had heart attack at age of 55 and died of a he was a smoker. His one brother and parents have no cardiac problem. He was diagnosed with elevated LDL of 142 in May of last year and was started on pravastatin that he is compliant with. He  denies any claudications or syncope. No orthopnea or proximal nocturnal dyspnea. His blood pressure is elevated today however he states it's usually normal.  05/04/2014 - the patient is coming after 2 months, he denies any chest pain or SOB, no palpitations or syncope.     Home Medications  Prior to Admission medications   Medication Sig Start Date End Date Taking? Authorizing Provider  esomeprazole (NEXIUM) 40 MG capsule Take 40 mg by mouth. 02/12/14 02/12/15 Yes Historical Provider, MD  etodolac (LODINE) 400 MG tablet Take 400 mg by mouth daily. 06/10/13  Yes Historical Provider, MD  pravastatin (PRAVACHOL) 10 MG tablet Take 10 mg by mouth daily.   Yes Historical Provider, MD  sucralfate (CARAFATE) 1 G tablet Take 1 g by mouth. 02/12/14 02/12/15 Yes Historical Provider, MD  trandolapril-verapamil (TARKA) 4-240 MG per tablet TAKE 1 TABLET EVERY DAY 03/01/13  Yes Ileana Ladd, MD  zolpidem (AMBIEN) 10 MG tablet TAKE 1 TABLET BY MOUTH AT BEDTIME 06/30/13  Yes Ileana Ladd, MD    Family History  Family History  Problem Relation Age of Onset  . Heart attack Paternal Grandfather   . Colon cancer Neg Hx   . Colon polyps Neg Hx   . Kidney disease Neg Hx   . Esophageal cancer Neg Hx   . Diabetes Neg Hx   . Gallbladder disease Neg Hx   . Stomach cancer Neg Hx  Social History  History   Social History  . Marital Status: Married    Spouse Name: N/A  . Number of Children: 0  . 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 Smoking status: Never Smoker   . Smokeless tobacco: Never Used  . Alcohol Use: No  . Drug Use: No  . Sexual Activity: Not on file   Other Topics Concern  . Not on file   Social History Narrative     Review of Systems, as per HPI, otherwise negative General:  No chills, fever, night sweats or weight changes.  Cardiovascular:  No chest pain, dyspnea on exertion, edema, orthopnea, palpitations, paroxysmal  nocturnal dyspnea. Dermatological: No rash, lesions/masses Respiratory: No cough, dyspnea Urologic: No hematuria, dysuria Abdominal:   No nausea, vomiting, diarrhea, bright red blood per rectum, melena, or hematemesis Neurologic:  No visual changes, wkns, changes in mental status. All other systems reviewed and are otherwise negative except as noted above.  Physical Exam  Blood pressure 132/62, pulse 64, height 5\' 11"  (1.803 m), weight 191 lb (86.637 kg).  General: Pleasant, NAD Psych: Normal affect. Neuro: Alert and oriented X 3. Moves all extremities spontaneously. HEENT: Normal  Neck: Supple without bruits or JVD. Lungs:  Resp regular and unlabored, CTA. Heart: RRR no s3, s4, or murmurs. Abdomen: Soft, non-tender, non-distended, BS + x 4.  Extremities: No clubbing, cyanosis or edema. DP/PT/Radials 2+ and equal bilaterally.  Labs:  No results for input(s): CKTOTAL, CKMB, TROPONINI in the last 72 hours. Lab Results  Component Value Date   WBC 6.1 03/24/2011   HGB 15.3 12/06/2013   HCT 45.0 12/06/2013   MCV 91.3 03/24/2011   PLT 185 03/24/2011    No results found for: DDIMER Invalid input(s): POCBNP    Component Value Date/Time   NA 135* 12/06/2013 1312   NA 138 06/30/2013 0902   K 4.4 12/06/2013 1312   CL 99 06/30/2013 0902   CO2 25 06/30/2013 0902   GLUCOSE 84 12/06/2013 1312   GLUCOSE 85 06/30/2013 0902   BUN 19 06/30/2013 0902   BUN 15 07/23/2012 1059   CREATININE 1.00 06/30/2013 0902   CREATININE 1.03 07/23/2012 1059   CALCIUM 9.3 06/30/2013 0902   PROT 7.0 06/30/2013 0902   PROT 7.3 07/23/2012 1059   ALBUMIN 4.9 07/23/2012 1059   AST 16 06/30/2013 0902   ALT 15 06/30/2013 0902   ALKPHOS 59 06/30/2013 0902   BILITOT 0.5 06/30/2013 0902   GFRNONAA 92 06/30/2013 0902   GFRNONAA 89 07/23/2012 1059   GFRAA 106 06/30/2013 0902   GFRAA >89 07/23/2012 1059   Lab Results  Component Value Date   CHOL 207* 06/30/2013   HDL 39* 06/30/2013   LDLCALC 142*  06/30/2013   TRIG 129 06/30/2013    Accessory Clinical Findings  Echocardiogram - none  ECG - sinus bradycardia with first-degree AV block, 54 bpm, otherwise normal EKG.  ETT:  ST Segment Analysis At Rest: normal ST segments - no evidence of significant ST depression With Exercise: no evidence of significant ST depression  Other Information Arrhythmia:  No Angina during ETT:  absent (0) Quality of ETT:  diagnostic  ETT Interpretation:  normal - no evidence of ischemia by ST analysis  Comments: Good exercise capacity. No chest pain. Normal BP response to exercise. No ST changes to suggest ischemia.   TTE: 04/06/2014 Left ventricle: The cavity size was normal. Systolic function was normal.  The estimated ejection fraction was in the range of 60% to 65%. Wall motion was normal; there were no regional wall motion abnormalities. Doppler parameters are consistent with abnormal left ventricular relaxation (grade 1 diastolic dysfunction). There was no evidence of elevated ventricular filling pressure by Doppler parameters. - Aortic valve: Trileaflet; normal thickness leaflets. There was mild regurgitation. - Mitral valve: Structurally normal valve. - Left atrium: The atrium was normal in size. - Right ventricle: Systolic function was normal. - Right atrium: The atrium was normal in size. - Tricuspid valve: There was no regurgitation. - Pulmonic valve: Transvalvular velocity was within the normal range. There was no evidence for stenosis. There was no regurgitation. - Pulmonary arteries: Systolic pressure was within the normal range. - Inferior vena cava: The vessel was normal in size. - Pericardium, extracardiac: There was no pericardial effusion.   ETT: 04/20/2014  ETT Interpretation: normal - no evidence of ischemia by ST analysis  Comments: Good exercise capacity. No chest pain. Normal BP response to exercise. No ST changes to suggest ischemia.      Assessment & Plan  44 year old gentleman  1. Chest pain with some very atypical features that seem to be related to GI system but also typical chest pain related to exertion. His baseline EKG has no ischemic changes, ETT was normal.   2. Hypertension - the patient states that he is usually normal however his already on blood pressure medicine and still hypertensive. The stress test showed normal response to stress. Normal BP today, we will continue th same regimen.  3. Hyperlipidemia - with LDL 142 in May of last year started on pravastatin.  Follow up in 1 year.     Lars Masson, MD, [facility] 05/04/2014, 9:28 AM

## 2016-10-10 ENCOUNTER — Encounter (INDEPENDENT_AMBULATORY_CARE_PROVIDER_SITE_OTHER): Payer: Self-pay

## 2016-10-10 ENCOUNTER — Ambulatory Visit (INDEPENDENT_AMBULATORY_CARE_PROVIDER_SITE_OTHER): Payer: BLUE CROSS/BLUE SHIELD | Admitting: Physician Assistant

## 2016-10-10 ENCOUNTER — Encounter: Payer: Self-pay | Admitting: Physician Assistant

## 2016-10-10 VITALS — BP 140/94 | HR 64 | Ht 71.0 in | Wt 192.4 lb

## 2016-10-10 DIAGNOSIS — K644 Residual hemorrhoidal skin tags: Secondary | ICD-10-CM | POA: Diagnosis not present

## 2016-10-10 DIAGNOSIS — K219 Gastro-esophageal reflux disease without esophagitis: Secondary | ICD-10-CM

## 2016-10-10 MED ORDER — LIDOCAINE 5 % EX OINT: TOPICAL_OINTMENT | CUTANEOUS | 1 refills | Status: DC

## 2016-10-10 NOTE — Patient Instructions (Signed)
We have sent the following medications to your pharmacy for you to pick up at your convenience: [facility]. 1. Lidocaine ointment 5 %.  Continue Preperation H- 3-4 times daily until symptoms have resolved.  Soak in tum of hot water 15-20-minutes twice daily until symptoms have resolved.   If symptoms persis, call and make an appointment with either Dr. Rhea Belton or Mike Gip PA.   You should have a colonoscopy at age 46. If symptoms persist we will do the colonoscopy sooner.

## 2016-10-10 NOTE — Progress Notes (Addendum)
Subjective:    Patient ID: Russell Gregory, male    DOB: 1970/05/21, 46 y.o.   MRN: [id number]  HPI  Russell Gregory Is a pleasant 46 year old white male known to Dr. Rhea Belton who comes in today with complaints of rectal bleeding and pain.[name] He was last seen in our office in 2016 and has been followed for GERD. He underwent EGD in January 2016 with finding of a 2 cm hiatal hernia, no evidence of Barrett's and did have acute gastritis which was H. pylori negative. He has not had colonoscopy. He states that he has had his current symptoms for a couple of weeks and that actually is felt a bit better over the past 2 days. Says he works out in the heat sometimes gets anal irritation. He also does a lot of lifting etc. He has had small amounts of bright red blood noted on the tissue over the past couple of weeks off and on and has had anal discomfort and some swelling. He has not had any changes in his bowel habits, no excessive straining constipation etc. and has no complaints of abdominal pain. The  anal discomfort is external. He has been using some Preparation H which  is helpful. Family history is negative for colon cancer or polyps as far as he is aware.  Review of Systems Pertinent positive and negative review of systems were noted in the above HPI section.  All other review of systems was otherwise negative.  Outpatient Encounter Prescriptions as of 10/10/2016  Medication Sig  . esomeprazole (NEXIUM) 40 MG capsule Take 1 capsule (40 mg total) by mouth daily at 12 noon.  . etodolac (LODINE) 500 MG tablet Take 1 tablet by mouth as needed.  . pravastatin (PRAVACHOL) 20 MG tablet Take 20 mg by mouth daily.  . trandolapril-verapamil (TARKA) 4-240 MG per tablet TAKE 1 TABLET EVERY DAY  . zolpidem (AMBIEN) 10 MG tablet TAKE 1 TABLET BY MOUTH AT BEDTIME  . lidocaine (XYLOCAINE) 5 % ointment Use 1 application 3-4 times daily for anal pain.  . [DISCONTINUED] etodolac (LODINE) 400 MG tablet Take 400 mg by mouth  daily.  . [DISCONTINUED] sucralfate (CARAFATE) 1 G tablet Take 1 g by mouth.   No facility-administered encounter medications on file as of 10/10/2016.    Allergies  Allergen Reactions  . Morphine And Related Nausea And Vomiting    If given too much   Patient Active Problem List   Diagnosis Date Noted  . Chest pain at rest 05/05/2014  . Kidney calculi   . Insomnia 07/23/2012  . HLD (hyperlipidemia) 07/23/2012  . HTN (hypertension) 07/23/2012  . GERD (gastroesophageal reflux disease) 07/23/2012   Social History   Social History  . Marital status: Married    Spouse name: N/A  . Number of children: 0  . 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 Smoking status: Never Smoker  . Smokeless tobacco: Never Used  . Alcohol use No  . Drug use: No  . Sexual activity: Not on file   Other Topics Concern  . Not on file   Social History Narrative  . No narrative on file    Mr. Rhinehart's family history includes Heart attack in his paternal grandfather.      Objective:    Vitals:   10/10/16 0856  BP: (!) 140/94  Pulse: 64    Physical Exam  Well-developed white male in no acute distress, pleasant  blood pressure 140/94, pulse 64, height 5 foot 11, weight 192, BMI 26.8. HEENT; nontraumatic, cephalic EOMI PERRLA sclera anicteric, Cardiovascular ;regular rate and rhythm with S1-S2 no murmur or gallop, Pulmonary ;clear bilaterally Abdomen;soft, nontender nondistended bowel sounds are active there is no palpable mass or hepatosplenomegaly, Rectal ;exam patient has a swollen and inflamed external hemorrhoid which is not thrombosed, digital exam is negative, Extremities; no clubbing cyanosis or edema skin warm and dry, Neuropsych ;mood and affect appropriate       Assessment & Plan:   #37 46 year old white male with 2 week history of anal discomfort and swelling and intermittent bright red blood noted only on the tissue. Exam is  pertinent for a swollen external  hemorrhoid which is not thrombosed.  #2 GERD stable  Plan; Patient is advised to start soaking in a hot tub of water for 15-20 minutes once or twice daily until symptoms subside He will continue using Preparation H 3-4 times daily until symptoms subside and we'll send a prescription for lidocaine 5% gel to apply 3-4 times daily in addition for discomfort. He is asked to call back for appointment for follow-up if discomfort and/or bleeding persist or recur. Plan screening colonoscopy at age 31.  Amy S Esterwood PA-C 10/10/2016   Cc: Ileana Ladd, MD   Addendum: Reviewed and agree with initial management. Pyrtle, Carie Caddy, MD

## 2017-01-22 ENCOUNTER — Other Ambulatory Visit: Payer: Self-pay

## 2017-01-22 ENCOUNTER — Emergency Department (HOSPITAL_BASED_OUTPATIENT_CLINIC_OR_DEPARTMENT_OTHER)
Admission: EM | Admit: 2017-01-22 | Discharge: 2017-01-22 | Disposition: A | Discharge status: Home / Self Care | Payer: BLUE CROSS/BLUE SHIELD | Attending: Emergency Medicine | Admitting: Emergency Medicine

## 2017-01-22 ENCOUNTER — Encounter (HOSPITAL_BASED_OUTPATIENT_CLINIC_OR_DEPARTMENT_OTHER): Payer: Self-pay | Admitting: *Deleted

## 2017-01-22 ENCOUNTER — Emergency Department (HOSPITAL_BASED_OUTPATIENT_CLINIC_OR_DEPARTMENT_OTHER): Payer: BLUE CROSS/BLUE SHIELD

## 2017-01-22 DIAGNOSIS — Y998 Other external cause status: Secondary | ICD-10-CM | POA: Diagnosis not present

## 2017-01-22 DIAGNOSIS — X509XXA Other and unspecified overexertion or strenuous movements or postures, initial encounter: Secondary | ICD-10-CM | POA: Diagnosis not present

## 2017-01-22 DIAGNOSIS — I10 Essential (primary) hypertension: Secondary | ICD-10-CM | POA: Diagnosis not present

## 2017-01-22 DIAGNOSIS — Y92007 Garden or yard of unspecified non-institutional (private) residence as the place of occurrence of the external cause: Secondary | ICD-10-CM | POA: Diagnosis not present

## 2017-01-22 DIAGNOSIS — Z79899 Other long term (current) drug therapy: Secondary | ICD-10-CM | POA: Insufficient documentation

## 2017-01-22 DIAGNOSIS — Y93H1 Activity, digging, shoveling and raking: Secondary | ICD-10-CM | POA: Diagnosis not present

## 2017-01-22 DIAGNOSIS — S93491A Sprain of other ligament of right ankle, initial encounter: Secondary | ICD-10-CM | POA: Diagnosis not present

## 2017-01-22 DIAGNOSIS — S99911A Unspecified injury of right ankle, initial encounter: Secondary | ICD-10-CM | POA: Diagnosis present

## 2017-01-22 MED ORDER — ONDANSETRON 4 MG PO TBDP
4.0000 mg | ORAL_TABLET | Freq: Once | ORAL | Status: AC
Start: 2017-01-22 — End: 2017-01-22
  Administered 2017-01-22: 4 mg via ORAL
  Filled 2017-01-22: qty 1

## 2017-01-22 MED ORDER — HYDROCODONE-ACETAMINOPHEN 5-325 MG PO TABS
2.0000 | ORAL_TABLET | Freq: Once | ORAL | Status: AC
  Administered 2017-01-22: 2 via ORAL
  Filled 2017-01-22: qty 2

## 2017-01-22 MED ORDER — NAPROXEN 500 MG PO TABS: 500.0000 mg | ORAL_TABLET | Freq: Two times a day (BID) | ORAL | 0 refills | Status: DC | PRN

## 2017-01-22 MED ORDER — HYDROCODONE-ACETAMINOPHEN 5-325 MG PO TABS: 1.0000 | ORAL_TABLET | ORAL | 0 refills | Status: DC | PRN

## 2017-01-22 NOTE — ED Provider Notes (Signed)
[facility] EMERGENCY DEPARTMENT Provider Note   CSN: [id number] Arrival date & time: 01/22/17  1616     History   Chief Complaint Chief Complaint  Patient presents with  . Fall  . Ankle Injury    HPI Russell Gregory is a 46 y.o. male.  HPI   45 yo M with PMHx as below here with ankle pain. Pt was raking leaves in his yard when he stepped into a small pothole (<1 foot). He stepped onto his right foot which inverted. He was wearing boots. He reports immediate onset of severe aching, throbbing, ankle pain. He was initially able to put weight on it but now cannot. No open wounds. No numbness or tingling. He did not fall and there was no other trauma. No h/o similar injuries. Pain worsens with any movement and palpation. No alleviating factors.  Past Medical History:  Diagnosis Date  . Dysuria-frequency syndrome   . GERD (gastroesophageal reflux disease)   . Hyperlipidemia   . Hypertension   . Inguinal hernia   . Insomnia   . Right ureteral stone     Patient Active Problem List   Diagnosis Date Noted  . Chest pain at rest 05/05/2014  . Kidney calculi   . Insomnia 07/23/2012  . HLD (hyperlipidemia) 07/23/2012  . HTN (hypertension) 07/23/2012  . GERD (gastroesophageal reflux disease) 07/23/2012    Past Surgical History:  Procedure Laterality Date  . CYSTOSCOPY WITH RETROGRADE PYELOGRAM, URETEROSCOPY AND STENT PLACEMENT Right 12/06/2013   Procedure: CYSTOSCOPY WITH RETROGRADE PYELOGRAM, URETEROSCOPY AND STONE BASKETRY;  Surgeon: Anner Crete, MD;  Location: [facility];  Service: Urology;  Laterality: Right;  . HERNIA REPAIR     left inguinal repair  . TONSILLECTOMY         Home Medications    Prior to Admission medications   Medication Sig Start Date End Date Taking? Authorizing Provider  esomeprazole (NEXIUM) 40 MG capsule Take 1 capsule (40 mg total) by mouth daily at 12 noon. 03/14/14   Pyrtle, Carie Caddy, MD  etodolac (LODINE) 500 MG  tablet Take 1 tablet by mouth as needed. 09/20/16   [provider]  HYDROcodone-acetaminophen (NORCO/VICODIN) 5-325 MG tablet Take 1-2 tablets by mouth every 4 (four) hours as needed for severe pain. 01/22/17   Shaune Pollack, MD  lidocaine (XYLOCAINE) 5 % ointment Use 1 application 3-4 times daily for anal pain. 10/10/16   Esterwood, Amy S, PA-C  naproxen (NAPROSYN) 500 MG tablet Take 1 tablet (500 mg total) by mouth 2 (two) times daily as needed for moderate pain. 01/22/17   Shaune Pollack, MD  pravastatin (PRAVACHOL) 20 MG tablet Take 20 mg by mouth daily. 03/12/14   [provider]  trandolapril-verapamil Jolene Provost) 4-240 MG per tablet TAKE 1 TABLET EVERY DAY 03/01/13   Ileana Ladd, MD  zolpidem (AMBIEN) 10 MG tablet TAKE 1 TABLET BY MOUTH AT BEDTIME 06/30/13   Ileana Ladd, MD    Family History Family History  Problem Relation Age of Onset  . Heart attack Paternal Grandfather   . Colon cancer Neg Hx   . Colon polyps Neg Hx   . Kidney disease Neg Hx   . Esophageal cancer Neg Hx   . Diabetes Neg Hx   . Gallbladder disease Neg Hx   . Stomach cancer Neg Hx     Social History Social History   Tobacco Use  . Smoking status: Never Smoker  . Smokeless tobacco: Never Used  Substance  Use Topics  . Alcohol use: No    Alcohol/week: 0.0 oz  . Drug use: No     Allergies   Morphine and related   Review of Systems Review of Systems  Constitutional: Negative for chills and fever.  Respiratory: Negative for shortness of breath.   Cardiovascular: Negative for chest pain.  Musculoskeletal: Negative for neck pain.  Skin: Negative for rash and wound.  Allergic/Immunologic: Negative for immunocompromised state.  Neurological: Negative for weakness and numbness.  Hematological: Does not bruise/bleed easily.     Physical Exam Updated Vital Signs BP 134/87   Pulse 75   Temp 98.2 F (36.8 C) (Oral)   Resp 14   Ht 6' (1.829 m)   Wt 83.9 kg (185 lb)   SpO2 100%    BMI 25.09 kg/m   Physical Exam  Constitutional: He is oriented to person, place, and time. He appears well-developed and well-nourished. No distress.  HENT:  Head: Normocephalic and atraumatic.  Eyes: Conjunctivae are normal.  Neck: Neck supple.  Cardiovascular: Normal rate, regular rhythm and normal heart sounds.  Pulmonary/Chest: Effort normal. No respiratory distress. He has no wheezes.  Abdominal: He exhibits no distension.  Musculoskeletal: He exhibits no edema.  Neurological: He is alert and oriented to person, place, and time. He exhibits normal muscle tone.  Skin: Skin is warm. Capillary refill takes less than 2 seconds. No rash noted.  Nursing note and vitals reviewed.   LOWER EXTREMITY EXAM: RIGHT  INSPECTION & PALPATION: Marked swelling along lateral malleolus with diffuse TTP, mild bruising. Minimal TTP over medial mal. No obvious deformity, only marked swelling. No TTP over knee, or proximal leg. Negative squeeze test.  SENSORY: sensation is intact to light touch in:  Superficial peroneal nerve distribution (over dorsum of foot) Deep peroneal nerve distribution (over first dorsal web space) Sural nerve distribution (over lateral aspect 5th metatarsal) Saphenous nerve distribution (over medial instep)  MOTOR:  + Motor EHL (great toe dorsiflexion) + FHL (great toe plantar flexion)  + TA (ankle dorsiflexion)  + GSC (ankle plantar flexion)  VASCULAR: 2+ dorsalis pedis and posterior tibialis pulses Capillary refill < 2 sec, toes warm and well-perfused  COMPARTMENTS: Soft, warm, well-perfused No pain with passive extension No parethesias    ED Treatments / Results  Labs (all labs ordered are listed, but only abnormal results are displayed) Labs Reviewed - No data to display  EKG  EKG Interpretation None       Radiology Dg Ankle Complete Right  Result Date: 01/22/2017 CLINICAL DATA:  46 y/o  M; twisting injury of the ankle. EXAM: RIGHT ANKLE -  COMPLETE 3+ VIEW COMPARISON:  None. FINDINGS: No acute fracture or dislocation identified. Talar dome is intact. Ankle mortise is symmetric. Small plantar calcaneal enthesophyte. Ankle joint effusion and soft tissue swelling over lateral malleolus. IMPRESSION: 1.  No acute fracture or dislocation identified. 2. Ankle joint effusion and soft tissue swelling over lateral malleolus. Electronically Signed   By: Mitzi HansenLance  Furusawa-Stratton M.D.   On: 01/22/2017 16:53    Procedures Procedures (including critical care time)  Medications Ordered in ED Medications  HYDROcodone-acetaminophen (NORCO/VICODIN) 5-325 MG per tablet 2 tablet (2 tablets Oral Given 01/22/17 1654)  ondansetron (ZOFRAN-ODT) disintegrating tablet 4 mg (4 mg Oral Given 01/22/17 1654)     Initial Impression / Assessment and Plan / ED Course  I have reviewed the triage vital signs and the nursing notes.  Pertinent labs & imaging results that were available during my care of  the patient were reviewed by me and considered in my medical decision making (see chart for details).     46 yo M with PMHx as above here with R ankle pain after fall. He has marked TTP and swelling over lateral malleolus c/w likely grade III sprain. Imaging is neg for fx. Distal NV is intact. Given his marked TTP and degree of swelling, will place him in CAM walker for nWB x 1 week, then return to WB and exercises. Will refer to sports clinic as he is on his feet at work and may benefit from rehab. Analgesia given.   Final Clinical Impressions(s) / ED Diagnoses   Final diagnoses:  Sprain of anterior talofibular ligament of right ankle, initial encounter    ED Discharge Orders        Ordered    HYDROcodone-acetaminophen (NORCO/VICODIN) 5-325 MG tablet  Every 4 hours PRN     01/22/17 1717    naproxen (NAPROSYN) 500 MG tablet  2 times daily PRN     01/22/17 1717       Shaune PollackIsaacs, Tandrea Kommer, MD 01/22/17 1721

## 2017-01-22 NOTE — Discharge Instructions (Signed)
Do not put any weight on your ankle/foot for the next week.  You may then start to put small amount of weight, followed by gradual return to full weightbearing. It is important that you follow-up with your doctor and/or Dr. Pearletha ForgeHudnall for follow-up.

## 2017-01-22 NOTE — ED Triage Notes (Signed)
He stepped in a hole and fell. Injury to his right ankle.

## 2017-01-26 ENCOUNTER — Ambulatory Visit: Payer: BLUE CROSS/BLUE SHIELD | Admitting: Family Medicine

## 2017-01-26 ENCOUNTER — Encounter: Payer: Self-pay | Admitting: Family Medicine

## 2017-01-26 DIAGNOSIS — S99911A Unspecified injury of right ankle, initial encounter: Secondary | ICD-10-CM

## 2017-01-26 NOTE — Patient Instructions (Signed)
You have an ankle sprain. Ice the area for 15 minutes at a time, 3-4 times a day Aleve 2 tabs twice a day with food OR ibuprofen 3 tabs three times a day with food for pain and inflammation. Elevate above the level of your heart when possible Crutches if needed to help with walking Bear weight when tolerated Use ankle brace when up and walking around to help with stability while you recover from this injury. Come out of the brace twice a day to do Up/down and alphabet exercises 2-3 sets of each. Start theraband strengthening exercises when tolerated - once a day 3 sets of 10. Consider physical therapy for strengthening and balance exercises. If not improving as expected, we may repeat x-rays or consider further testing like an MRI. Follow up in 2 weeks - ok to return sooner if you feel you can return to work.

## 2017-01-28 ENCOUNTER — Encounter: Payer: Self-pay | Admitting: Family Medicine

## 2017-01-28 DIAGNOSIS — S99911D Unspecified injury of right ankle, subsequent encounter: Secondary | ICD-10-CM | POA: Insufficient documentation

## 2017-01-28 HISTORY — DX: Unspecified injury of right ankle, subsequent encounter: S99.911D

## 2017-01-28 NOTE — Progress Notes (Signed)
PCP: Ileana LaddWong, Francis P, MD  Subjective:   HPI: Patient is a 46 y.o. male here for right ankle injury.  Patient reports on 11/30 he was raking leaves in his mother in laws yard when he stepped in a hold, inverted his right ankle and fell down. + swelling and bruising. He has been using crutches, icing. Pain level 0/10 but worse in morning and throbs. Has been wearing cam walker also. Not requiring any medications for pain. Was able to bear weight right after the injury. No skin changes, numbness.  Past Medical History:  Diagnosis Date  . Dysuria-frequency syndrome   . GERD (gastroesophageal reflux disease)   . Hyperlipidemia   . Hypertension   . Inguinal hernia   . Insomnia   . Right ureteral stone     Current Outpatient Medications on File Prior to Visit  Medication Sig Dispense Refill  . esomeprazole (NEXIUM) 40 MG capsule Take 1 capsule (40 mg total) by mouth daily at 12 noon. 90 capsule 0  . etodolac (LODINE) 500 MG tablet Take 1 tablet by mouth as needed.    [name]. HYDROcodone-acetaminophen (NORCO/VICODIN) 5-325 MG tablet Take 1-2 tablets by mouth every 4 (four) hours as needed for severe pain. 12 tablet 0  . lidocaine (XYLOCAINE) 5 % ointment Use 1 application 3-4 times daily for anal pain. 35.44 g 1  . naproxen (NAPROSYN) 500 MG tablet Take 1 tablet (500 mg total) by mouth 2 (two) times daily as needed for moderate pain. 20 tablet 0  . pravastatin (PRAVACHOL) 20 MG tablet Take 20 mg by mouth daily.    . trandolapril-verapamil (TARKA) 4-240 MG per tablet TAKE 1 TABLET EVERY DAY 90 tablet 3  . zolpidem (AMBIEN) 10 MG tablet TAKE 1 TABLET BY MOUTH AT BEDTIME 30 tablet 3   No current facility-administered medications on file prior to visit.     Past Surgical History:  Procedure Laterality Date  . CYSTOSCOPY WITH RETROGRADE PYELOGRAM, URETEROSCOPY AND STENT PLACEMENT Right 12/06/2013   Procedure: CYSTOSCOPY WITH RETROGRADE PYELOGRAM, URETEROSCOPY AND STONE BASKETRY;  Surgeon:  Anner CreteJohn J Wrenn, MD;  [facility] [place];  Service: Urology;  Laterality: Right;  . HERNIA REPAIR     left inguinal repair  . TONSILLECTOMY      Allergies  Allergen Reactions  . Morphine And Related Nausea And Vomiting    If given too much    Social History   Socioeconomic History  . Marital status: Married    Spouse name: Not on file  . Number of children: 0  . Years of education: Not on file  . Highest education level: Not on file  Social Needs  . Financial resource strain: Not on file  . Food insecurity - worry: Not on file  . Food insecurity - inability: Not on file  . Transportation needs - medical: Not on file  . Transportation needs - non-medical: Not on file  Occupational History  . Occupation: Machinest  Tobacco Use  . Smoking status: Never Smoker  . Smokeless tobacco: Never Used  Substance and Sexual Activity  . Alcohol use: No    Alcohol/week: 0.0 oz  . Drug use: No  . Sexual activity: Not on file  Other Topics Concern  . Not on file  Social History Narrative  . Not on file    Family History  Problem Relation Age of Onset  . Heart attack Paternal Grandfather   . Colon cancer Neg Hx   . Colon polyps Neg Hx   .  Kidney disease Neg Hx   . Esophageal cancer Neg Hx   . Diabetes Neg Hx   . Gallbladder disease Neg Hx   . Stomach cancer Neg Hx     BP 129/86   Pulse 62   Ht 6' (1.829 m)   Wt 185 lb (83.9 kg)   BMI 25.09 kg/m   Review of Systems: See HPI above.     Objective:  Physical Exam:  Gen: NAD, comfortable in exam room  Right ankle: No gross deformity, swelling, ecchymoses Mild limitation ROM all directions with 5/5 strength. TTP over ATFL, lateral malleolus.  No other tenderness. 1+ ant drawer and talar tilt.   Negative syndesmotic compression. Thompsons test negative. NV intact distally.  Left ankle: No gross deformity, swelling, ecchymoses FROM with 5/5 strength. No TTP Negative ant drawer and talar tilt.    Thompsons test negative. NV intact distally.   Assessment & Plan:  1. Right ankle injury - independently reviewed radiographs and no evidence fracture.  2/2 lateral ankle sprain.  Icing, aleve or ibuprofen.  Crutches if needed.  Switch to ASO.  Shown motion exercises to do daily and advance to strengthening.  F/u in 2 weeks.

## 2017-01-28 NOTE — Assessment & Plan Note (Signed)
independently reviewed radiographs and no evidence fracture.  2/2 lateral ankle sprain.  Icing, aleve or ibuprofen.  Crutches if needed.  Switch to ASO.  Shown motion exercises to do daily and advance to strengthening.  F/u in 2 weeks.

## 2017-01-29 ENCOUNTER — Ambulatory Visit (HOSPITAL_BASED_OUTPATIENT_CLINIC_OR_DEPARTMENT_OTHER)
Admission: RE | Admit: 2017-01-29 | Discharge: 2017-01-29 | Disposition: A | Discharge status: Home / Self Care | Payer: BLUE CROSS/BLUE SHIELD | Source: Ambulatory Visit | Attending: Family Medicine | Admitting: Family Medicine

## 2017-01-29 ENCOUNTER — Encounter: Payer: Self-pay | Admitting: Family Medicine

## 2017-01-29 ENCOUNTER — Ambulatory Visit: Payer: BLUE CROSS/BLUE SHIELD | Admitting: Family Medicine

## 2017-01-29 VITALS — BP 119/77 | HR 98 | Ht 72.0 in | Wt 185.0 lb

## 2017-01-29 DIAGNOSIS — M7989 Other specified soft tissue disorders: Secondary | ICD-10-CM | POA: Insufficient documentation

## 2017-01-29 DIAGNOSIS — M25471 Effusion, right ankle: Secondary | ICD-10-CM | POA: Diagnosis not present

## 2017-01-29 DIAGNOSIS — S99911D Unspecified injury of right ankle, subsequent encounter: Secondary | ICD-10-CM

## 2017-01-29 DIAGNOSIS — X58XXXD Exposure to other specified factors, subsequent encounter: Secondary | ICD-10-CM | POA: Insufficient documentation

## 2017-01-29 MED ORDER — HYDROCODONE-ACETAMINOPHEN 5-325 MG PO TABS
1.0000 | ORAL_TABLET | Freq: Four times a day (QID) | ORAL | 0 refills | Status: DC | PRN
Start: 2017-01-29 — End: 2017-03-05

## 2017-01-29 NOTE — Patient Instructions (Signed)
You have an ankle sprain, possible very small avulsion fracture but these are treated similarly. Ice the area for 15 minutes at a time, 3-4 times a day Start vitamin C 500mg  daily to prevent CRPS. Aleve 2 tabs twice a day with food OR ibuprofen 3 tabs three times a day with food for pain and inflammation. Norco as needed for severe pain (don't take tylenol with this). Elevate above the level of your heart when possible Crutches if needed to help with walking Bear weight when tolerated Use boot when up and walking around to help with stability while you recover from this injury. Come out of the boot twice a day to do Up/down and alphabet exercises 2-3 sets of each. Start theraband strengthening exercises when tolerated - once a day 3 sets of 10. Consider physical therapy for strengthening and balance exercises. If not improving as expected, we may repeat x-rays or consider further testing like an MRI. Follow up with me in 1 week instead of on the 17th.

## 2017-01-30 ENCOUNTER — Telehealth: Payer: Self-pay | Admitting: Family Medicine

## 2017-01-30 MED ORDER — TRAMADOL HCL 50 MG PO TABS: 50.0000 mg | ORAL_TABLET | Freq: Four times a day (QID) | ORAL | 0 refills | Status: DC | PRN

## 2017-01-30 NOTE — Telephone Encounter (Signed)
Please send in the Tramadol to [facility] at [employer].

## 2017-01-30 NOTE — Telephone Encounter (Signed)
Ok I sent in the tramadol for him.  He stated he tolerated this previously.

## 2017-01-30 NOTE — Telephone Encounter (Signed)
We talked a little about the three options yesterday, hoping he would be able to tolerate hydrocodone if he took it with food.  Oxycodone would likely give the same reaction and is stronger than hydrocodone.  We could send in tramadol if he wants - his concern is this didn't help him previously with other issues.  Let me know.

## 2017-01-30 NOTE — Telephone Encounter (Signed)
Patient's wife calling regarding Hydrocodone Rx. States patient has had a reaction to the medication. He has broken out in hives, is itching and vomiting. Reaction started last night   Wants to know if another medication can be sent [facility]: [address]

## 2017-02-02 ENCOUNTER — Encounter: Payer: Self-pay | Admitting: Family Medicine

## 2017-02-02 NOTE — Assessment & Plan Note (Signed)
independently reviewed repeat radiographs and possible very small avulsion distal fibula but discussed would be treated similar to severe ankle sprain.  Icing, aleve or ibuprofen.  Norco as needed for severe pain (he reports this irritated his stomach before but he only tried once on empty stomach and tramadol hasn't worked for him before).  Start vitamin C as a precaution to prevent CRPS though pain would be only symptom currently and exam otherwise reassuring.  Crutches, boot, motion exercises.  F/u in 1 week.

## 2017-02-02 NOTE — Progress Notes (Signed)
PCP: Ileana LaddWong, Francis P, MD  Subjective:   HPI: Patient is a 46 y.o. male here for right ankle injury.  12/3: Patient reports on 11/30 he was raking leaves in his mother in laws yard when he stepped in a hold, inverted his right ankle and fell down. + swelling and bruising. He has been using crutches, icing. Pain level 0/10 but worse in morning and throbs. Has been wearing cam walker also. Not requiring any medications for pain. Was able to bear weight right after the injury. No skin changes, numbness.  12/6: Patient reports his pain has worsened since last visit. Pain level 5/10 and up to 8/10 and sharp. Still with swelling. Taking lodine and tylenol for pain. No fevers, chills, sweats, calf pain, redness.  Past Medical History:  Diagnosis Date  . Dysuria-frequency syndrome   . GERD (gastroesophageal reflux disease)   . Hyperlipidemia   . Hypertension   . Inguinal hernia   . Insomnia   . Right ureteral stone     Current Outpatient Medications on File Prior to Visit  Medication Sig Dispense Refill  . esomeprazole (NEXIUM) 40 MG capsule Take 1 capsule (40 mg total) by mouth daily at 12 noon. 90 capsule 0  . etodolac (LODINE) 500 MG tablet Take 1 tablet by mouth as needed.    . lidocaine (XYLOCAINE) 5 % ointment Use 1 application 3-4 times daily for anal pain. 35.44 g 1  . naproxen (NAPROSYN) 500 MG tablet Take 1 tablet (500 mg total) by mouth 2 (two) times daily as needed for moderate pain. 20 tablet 0  . pravastatin (PRAVACHOL) 20 MG tablet Take 20 mg by mouth daily.    . trandolapril-verapamil (TARKA) 4-240 MG per tablet TAKE 1 TABLET EVERY DAY 90 tablet 3  . zolpidem (AMBIEN) 10 MG tablet TAKE 1 TABLET BY MOUTH AT BEDTIME 30 tablet 3   No current facility-administered medications on file prior to visit.     Past Surgical History:  Procedure Laterality Date  . CYSTOSCOPY WITH RETROGRADE PYELOGRAM, URETEROSCOPY AND STENT PLACEMENT Right 12/06/2013   Procedure:  CYSTOSCOPY WITH RETROGRADE PYELOGRAM, URETEROSCOPY AND STONE BASKETRY;  Surgeon: Anner CreteJohn J Wrenn, MD;  [facility] [place];  Service: Urology;  Laterality: Right;  . HERNIA REPAIR     left inguinal repair  . TONSILLECTOMY      Allergies  Allergen Reactions  . Hydrocodone Hives and Nausea And Vomiting  . Morphine And Related Nausea And Vomiting    If given too much    Social History   Socioeconomic History  . Marital status: Married    Spouse name: Not on file  . Number of children: 0  . Years of education: Not on file  . Highest education level: Not on file  Social Needs  . Financial resource strain: Not on file  . Food insecurity - worry: Not on file  . Food insecurity - inability: Not on file  . Transportation needs - medical: Not on file  . Transportation needs - non-medical: Not on file  Occupational History  . Occupation: Machinest  Tobacco Use  . Smoking status: Never Smoker  . Smokeless tobacco: Never Used  Substance and Sexual Activity  . Alcohol use: No    Alcohol/week: 0.0 oz  . Drug use: No  . Sexual activity: Not on file  Other Topics Concern  . Not on file  Social History Narrative  . Not on file    Family History  Problem Relation Age of Onset  .  Heart attack Paternal Grandfather   . Colon cancer Neg Hx   . Colon polyps Neg Hx   . Kidney disease Neg Hx   . Esophageal cancer Neg Hx   . Diabetes Neg Hx   . Gallbladder disease Neg Hx   . Stomach cancer Neg Hx     BP 119/77   Pulse 98   Ht 6' (1.829 m)   Wt 185 lb (83.9 kg)   BMI 25.09 kg/m   Review of Systems: See HPI above.     Objective:  Physical Exam:  Gen: NAD, comfortable in exam room.  Right ankle: Mild swelling laterally with bruising.  No other deformity.  No erythema, shiny appearance to ankle. Mild limitation ROM all directions with 5/5 strength. TTP over ATFL, lateral malleolus.  No other tenderness.  No calf tenderness. 1+ ant drawer and talar tilt.    Negative syndesmotic compression. Thompsons test negative. NV intact distally.   Assessment & Plan:  1. Right ankle injury - independently reviewed repeat radiographs and possible very small avulsion distal fibula but discussed would be treated similar to severe ankle sprain.  Icing, aleve or ibuprofen.  Norco as needed for severe pain (he reports this irritated his stomach before but he only tried once on empty stomach and tramadol hasn't worked for him before).  Start vitamin C as a precaution to prevent CRPS though pain would be only symptom currently and exam otherwise reassuring.  Crutches, boot, motion exercises.  F/u in 1 week.

## 2017-02-05 ENCOUNTER — Ambulatory Visit: Payer: BLUE CROSS/BLUE SHIELD | Admitting: Family Medicine

## 2017-02-05 ENCOUNTER — Encounter: Payer: Self-pay | Admitting: Family Medicine

## 2017-02-05 DIAGNOSIS — S99911D Unspecified injury of right ankle, subsequent encounter: Secondary | ICD-10-CM

## 2017-02-05 MED ORDER — CEPHALEXIN 500 MG PO CAPS: 500.0000 mg | ORAL_CAPSULE | Freq: Four times a day (QID) | ORAL | 0 refills | Status: DC

## 2017-02-05 NOTE — Patient Instructions (Signed)
You have an ankle sprain, possible very small avulsion fracture but these are treated similarly. Ice the area for 15 minutes at a time, 3-4 times a day Continue vitamin C, etodolac as you have been. Consider topical aspercreme or biofreeze, salon pas if needed as well for pain. Elevate above the level of your heart when possible Crutches if needed to help with walking Bear weight when tolerated ACE wrap for compression under the boot to help compress the hematoma. Use boot when up and walking around to help with stability while you recover from this injury. Come out of the boot twice a day to do Up/down and alphabet exercises 2-3 sets of each. Start theraband strengthening exercises when tolerated - once a day 3 sets of 10. It's possible you have superimposed cellulitis though it's unlikely - we should cover for this with keflex 4 times a day. Follow up with me at the end of next week.

## 2017-02-06 ENCOUNTER — Encounter: Payer: Self-pay | Admitting: Family Medicine

## 2017-02-06 NOTE — Progress Notes (Signed)
PCP: Ileana LaddWong, Francis P, MD  Subjective:   HPI: Patient is a 46 y.o. male here for right ankle injury.  12/3: Patient reports on 11/30 he was raking leaves in his [place] yard when he stepped in a hold, inverted his right ankle and fell down. + swelling and bruising. He has been using crutches, icing. Pain level 0/10 but worse in morning and throbs. Has been wearing cam walker also. Not requiring any medications for pain. Was able to bear weight right after the injury. No skin changes, numbness.  12/6: Patient reports his pain has worsened since last visit. Pain level 5/10 and up to 8/10 and sharp. Still with swelling. Taking lodine and tylenol for pain. No fevers, chills, sweats, calf pain, redness.  12/13: Patient reports he's still having pain up to 8/10 level, lateral and sharp with weight bearing. Taking etodolac, vitamin C. Tramadol makes him feel sick as well so not taking. Using boot and crutches. Has been icing. No numbness.  Past Medical History:  Diagnosis Date  . Dysuria-frequency syndrome   . GERD (gastroesophageal reflux disease)   . Hyperlipidemia   . Hypertension   . Inguinal hernia   . Insomnia   . Right ureteral stone     Current Outpatient Medications on File Prior to Visit  Medication Sig Dispense Refill  . esomeprazole (NEXIUM) 40 MG capsule Take 1 capsule (40 mg total) by mouth daily at 12 noon. 90 capsule 0  . etodolac (LODINE) 500 MG tablet Take 1 tablet by mouth as needed.    [name]. HYDROcodone-acetaminophen (NORCO) 5-325 MG tablet Take 1 tablet by mouth every 6 (six) hours as needed for moderate pain. 20 tablet 0  . lidocaine (XYLOCAINE) 5 % ointment Use 1 application 3-4 times daily for anal pain. 35.44 g 1  . naproxen (NAPROSYN) 500 MG tablet Take 1 tablet (500 mg total) by mouth 2 (two) times daily as needed for moderate pain. 20 tablet 0  . pravastatin (PRAVACHOL) 20 MG tablet Take 20 mg by mouth daily.    . traMADol (ULTRAM) 50 MG  tablet Take 1 tablet (50 mg total) by mouth every 6 (six) hours as needed. 20 tablet 0  . trandolapril-verapamil (TARKA) 4-240 MG per tablet TAKE 1 TABLET EVERY DAY 90 tablet 3  . zolpidem (AMBIEN) 10 MG tablet TAKE 1 TABLET BY MOUTH AT BEDTIME 30 tablet 3   No current facility-administered medications on file prior to visit.     Past Surgical History:  Procedure Laterality Date  . CYSTOSCOPY WITH RETROGRADE PYELOGRAM, URETEROSCOPY AND STENT PLACEMENT Right 12/06/2013   Procedure: CYSTOSCOPY WITH RETROGRADE PYELOGRAM, URETEROSCOPY AND STONE BASKETRY;  Surgeon: Anner CreteJohn J Wrenn, MD;  [facility] [place];  Service: Urology;  Laterality: Right;  . HERNIA REPAIR     left inguinal repair  . TONSILLECTOMY      Allergies  Allergen Reactions  . Hydrocodone Hives and Nausea And Vomiting  . Morphine And Related Nausea And Vomiting    If given too much    Social History   Socioeconomic History  . Marital status: Married    Spouse name: Not on file  . Number of children: 0  . Years of education: Not on file  . Highest education level: Not on file  Social Needs  . Financial resource strain: Not on file  . Food insecurity - worry: Not on file  . Food insecurity - inability: Not on file  . Transportation needs - medical: Not on  file  . Transportation needs - non-medical: Not on file  Occupational History  . Occupation: Machin[employer]  Tobacco Use  . Smoking status: Never Smoker  . Smokeless tobacco: Never Used  Substance and Sexual Activity  . Alcohol use: No    Alcohol/week: 0.0 oz  . Drug use: No  . Sexual activity: Not on file  Other Topics Concern  . Not on file  Social History Narrative  . Not on file    Family History  Problem Relation Age of Onset  . Heart attack Paternal Grandfather   . Colon cancer Neg Hx   . Colon polyps Neg Hx   . Kidney disease Neg Hx   . Esophageal cancer Neg Hx   . Diabetes Neg Hx   . Gallbladder disease Neg Hx   . Stomach cancer  Neg Hx     BP 119/77   Pulse 60   Ht 6' (1.829 m)   Wt 185 lb (83.9 kg)   BMI 25.09 kg/m   Review of Systems: See HPI above.     Objective:  Physical Exam:  Gen: NAD, comfortable in exam room.  Right ankle: Mild lateral swelling lateral ankle.  Bruising into dorsal foot and toes.  Mild redness lateral ankle and foot.  No breaks in skin. Mild limitation ROM all directions with 5/5 strength. TTP over ATFL, lateral malleolus.  No other tenderness. 1+ ant drawer and talar tilt.   Negative syndesmotic compression. Thompsons test negative. NV intact distally.   Assessment & Plan:  1. Right ankle injury - Radiographs with possible very small avulsion of distal fibula.  Icing, etodolac.  Continue vitamin C.  Consider topical medications.  Boot with crutches if needed.  Noted to have a small hematoma over lateral malleolus by ultrasound - compression under boot for this.  Motion exercises.  Has some mild redness - will cover with keflex for possible superimposed cellulitis.  F/u at end of next week for reevaluation.

## 2017-02-06 NOTE — Assessment & Plan Note (Signed)
Radiographs with possible very small avulsion of distal fibula.  Icing, etodolac.  Continue vitamin C.  Consider topical medications.  Boot with crutches if needed.  Noted to have a small hematoma over lateral malleolus by ultrasound - compression under boot for this.  Motion exercises.  Has some mild redness - will cover with keflex for possible superimposed cellulitis.  F/u at end of next week for reevaluation.

## 2017-02-09 ENCOUNTER — Ambulatory Visit: Payer: BLUE CROSS/BLUE SHIELD | Admitting: Family Medicine

## 2017-02-12 ENCOUNTER — Ambulatory Visit: Payer: BLUE CROSS/BLUE SHIELD | Admitting: Family Medicine

## 2017-02-12 ENCOUNTER — Encounter: Payer: Self-pay | Admitting: Family Medicine

## 2017-02-12 DIAGNOSIS — S99911D Unspecified injury of right ankle, subsequent encounter: Secondary | ICD-10-CM

## 2017-02-12 NOTE — Patient Instructions (Signed)
You're improving as expected now. Finish the keflex. Ice the area for 15 minutes at a time, 3-4 times a day Etodolac as you have been for pain and inflammation. Elevate above the level of your heart when possible Use brace when up and walking around to help with stability while you recover from this injury. Come out of the brace twice a day to do Up/down and alphabet exercises 2-3 sets of each. Start theraband strengthening exercises - once a day 3 sets of 10. Consider physical therapy for strengthening and balance exercises. Follow up in 3 weeks for reevaluation.

## 2017-02-12 NOTE — Progress Notes (Signed)
PCP: Ileana LaddWong, Francis P, MD  Subjective:   HPI: Patient is a 46 y.o. male here for right ankle injury.  12/3: Patient reports on 11/30 he was raking leaves in his mother in laws yard when he stepped in a hold, inverted his right ankle and fell down. + swelling and bruising. He has been using crutches, icing. Pain level 0/10 but worse in morning and throbs. Has been wearing cam walker also. Not requiring any medications for pain. Was able to bear weight right after the injury. No skin changes, numbness.  12/6: Patient reports his pain has worsened since last visit. Pain level 5/10 and up to 8/10 and sharp. Still with swelling. Taking lodine and tylenol for pain. No fevers, chills, sweats, calf pain, redness.  12/13: Patient reports he's still having pain up to 8/10 level, lateral and sharp with weight bearing. Taking etodolac, vitamin C. Tramadol makes him feel sick as well so not taking. Using boot and crutches. Has been icing. No numbness.  12/20: Patient reports he's doing much better compared to a week ago. Pain level 0/10 but up to 5/10 with walking. Taking vitamin C, etodolac, keflex. No skin changes; redness has improved.  Past Medical History:  Diagnosis Date  . Dysuria-frequency syndrome   . GERD (gastroesophageal reflux disease)   . Hyperlipidemia   . Hypertension   . Inguinal hernia   . Insomnia   . Right ureteral stone     Current Outpatient Medications on File Prior to Visit  Medication Sig Dispense Refill  . cephALEXin (KEFLEX) 500 MG capsule Take 1 capsule (500 mg total) by mouth 4 (four) times daily. 28 capsule 0  . esomeprazole (NEXIUM) 40 MG capsule Take 1 capsule (40 mg total) by mouth daily at 12 noon. 90 capsule 0  . etodolac (LODINE) 500 MG tablet Take 1 tablet by mouth as needed.    [name]. HYDROcodone-acetaminophen (NORCO) 5-325 MG tablet Take 1 tablet by mouth every 6 (six) hours as needed for moderate pain. 20 tablet 0  . lidocaine (XYLOCAINE) 5  % ointment Use 1 application 3-4 times daily for anal pain. 35.44 g 1  . naproxen (NAPROSYN) 500 MG tablet Take 1 tablet (500 mg total) by mouth 2 (two) times daily as needed for moderate pain. 20 tablet 0  . pravastatin (PRAVACHOL) 20 MG tablet Take 20 mg by mouth daily.    . traMADol (ULTRAM) 50 MG tablet Take 1 tablet (50 mg total) by mouth every 6 (six) hours as needed. 20 tablet 0  . trandolapril-verapamil (TARKA) 4-240 MG per tablet TAKE 1 TABLET EVERY DAY 90 tablet 3  . zolpidem (AMBIEN) 10 MG tablet TAKE 1 TABLET BY MOUTH AT BEDTIME 30 tablet 3   No current facility-administered medications on file prior to visit.     Past Surgical History:  Procedure Laterality Date  . CYSTOSCOPY WITH RETROGRADE PYELOGRAM, URETEROSCOPY AND STENT PLACEMENT Right 12/06/2013   Procedure: CYSTOSCOPY WITH RETROGRADE PYELOGRAM, URETEROSCOPY AND STONE BASKETRY;  Surgeon: Anner CreteJohn J Wrenn, MD;  [facility] [place];  Service: Urology;  Laterality: Right;  . HERNIA REPAIR     left inguinal repair  . TONSILLECTOMY      Allergies  Allergen Reactions  . Hydrocodone Hives and Nausea And Vomiting  . Morphine And Related Nausea And Vomiting    If given too much    Social History   Socioeconomic History  . Marital status: Married    Spouse name: Not on file  . Number of  children: 0  . Years of education: Not on file  . Highest education level: Not on file  Social Needs  . Financial resource strain: Not on file  . Food insecurity - worry: Not on file  . Food insecurity - inability: Not on file  . Transportation needs - medical: Not on file  . Transportation needs - non-medical: Not on file  Occupational History  . Occupation: Machin[employer]  Tobacco Use  . Smoking status: Never Smoker  . Smokeless tobacco: Never Used  Substance and Sexual Activity  . Alcohol use: No    Alcohol/week: 0.0 oz  . Drug use: No  . Sexual activity: Not on file  Other Topics Concern  . Not on file  Social  History Narrative  . Not on file    Family History  Problem Relation Age of Onset  . Heart attack Paternal Grandfather   . Colon cancer Neg Hx   . Colon polyps Neg Hx   . Kidney disease Neg Hx   . Esophageal cancer Neg Hx   . Diabetes Neg Hx   . Gallbladder disease Neg Hx   . Stomach cancer Neg Hx     BP 132/90   Pulse 86   Ht 6' (1.829 m)   Wt 185 lb (83.9 kg)   BMI 25.09 kg/m   Review of Systems: See HPI above.     Objective:  Physical Exam:  Gen: NAD, comfortable in exam room.  Right ankle: Mild lateral swelling but improved.  Redness and warmth resolved.  Bruising still noted in distal dorsal foot and 2nd-4th digits.   Mild limitation ROM all directions with 5/5 strength. TTP over lateral malleolus.  No other tenderness. 1+ ant drawer and talar tilt.   Negative syndesmotic compression. Thompsons test negative. NV intact distally.   Assessment & Plan:  1. Right ankle injury - Radiographs with small avulsion distal fibula.  Clinically improving now.  Finish keflex.  Continue vitamin C, etodolac with icing.  ASO when up and walking around.  Start strengthening.  F/u in 3 weeks for reevaluation.

## 2017-02-12 NOTE — Assessment & Plan Note (Signed)
Radiographs with small avulsion distal fibula.  Clinically improving now.  Finish keflex.  Continue vitamin C, etodolac with icing.  ASO when up and walking around.  Start strengthening.  F/u in 3 weeks for reevaluation.

## 2017-03-05 ENCOUNTER — Encounter: Payer: Self-pay | Admitting: Family Medicine

## 2017-03-05 ENCOUNTER — Ambulatory Visit: Payer: BLUE CROSS/BLUE SHIELD | Admitting: Family Medicine

## 2017-03-05 DIAGNOSIS — S99911D Unspecified injury of right ankle, subsequent encounter: Secondary | ICD-10-CM | POA: Diagnosis not present

## 2017-03-05 MED ORDER — ETODOLAC 500 MG PO TABS: 500.0000 mg | ORAL_TABLET | Freq: Two times a day (BID) | ORAL | 1 refills | Status: DC

## 2017-03-05 NOTE — Progress Notes (Signed)
PCP: Ileana LaddWong, Francis P, MD  Subjective:   HPI: Patient is a 47 y.o. male here for right ankle injury.  12/3: Patient reports on 11/30 he was raking leaves in his mother in laws yard when he stepped in a hold, inverted his right ankle and fell down. + swelling and bruising. He has been using crutches, icing. Pain level 0/10 but worse in morning and throbs. Has been wearing cam walker also. Not requiring any medications for pain. Was able to bear weight right after the injury. No skin changes, numbness.  12/6: Patient reports his pain has worsened since last visit. Pain level 5/10 and up to 8/10 and sharp. Still with swelling. Taking lodine and tylenol for pain. No fevers, chills, sweats, calf pain, redness.  12/13: Patient reports he's still having pain up to 8/10 level, lateral and sharp with weight bearing. Taking etodolac, vitamin C. Tramadol makes him feel sick as well so not taking. Using boot and crutches. Has been icing. No numbness.  02/12/17: Patient reports he's doing much better compared to a week ago. Pain level 0/10 but up to 5/10 with walking. Taking vitamin C, etodolac, keflex. No skin changes; redness has improved.  03/05/17: Patient reports he's about 90-95% better compared to last visit. Takes etodolac but more for his left plantar fascia and for his shoulder. Doing home exercises. Feels unusual doing steps at times. Pain 0/10 currently. No skin changes, numbness.  Past Medical History:  Diagnosis Date  . Dysuria-frequency syndrome   . GERD (gastroesophageal reflux disease)   . Hyperlipidemia   . Hypertension   . Inguinal hernia   . Insomnia   . Right ureteral stone     Current Outpatient Medications on File Prior to Visit  Medication Sig Dispense Refill  . esomeprazole (NEXIUM) 40 MG capsule Take 1 capsule (40 mg total) by mouth daily at 12 noon. 90 capsule 0  . lidocaine (XYLOCAINE) 5 % ointment Use 1 application 3-4 times daily for anal  pain. 35.44 g 1  . pravastatin (PRAVACHOL) 20 MG tablet Take 20 mg by mouth daily.    . trandolapril-verapamil (TARKA) 4-240 MG per tablet TAKE 1 TABLET EVERY DAY 90 tablet 3  . zolpidem (AMBIEN) 10 MG tablet TAKE 1 TABLET BY MOUTH AT BEDTIME 30 tablet 3   No current facility-administered medications on file prior to visit.     Past Surgical History:  Procedure Laterality Date  . CYSTOSCOPY WITH RETROGRADE PYELOGRAM, URETEROSCOPY AND STENT PLACEMENT Right 12/06/2013   Procedure: CYSTOSCOPY WITH RETROGRADE PYELOGRAM, URETEROSCOPY AND STONE BASKETRY;  Surgeon: Anner CreteJohn J Wrenn, MD;  [facility];  Service: Urology;  Laterality: Right;  . HERNIA REPAIR     left inguinal repair  . TONSILLECTOMY      Allergies  Allergen Reactions  . Hydrocodone Hives and Nausea And Vomiting  . Morphine And Related Nausea And Vomiting    If given too much    Social History   Socioeconomic History  . Marital status: Married    Spouse name: Not on file  . Number of children: 0  . Years of education: Not on file  . Highest education level: Not on file  Social Needs  . Financial resource strain: Not on file  . Food insecurity - worry: Not on file  . Food insecurity - inability: Not on file  . Transportation needs - medical: Not on file  . Transportation needs - non-medical: Not on file  Occupational History  . Occupation: Therapist, [employer]  Tobacco Use  . Smoking status: Never Smoker  . Smokeless tobacco: Never Used  Substance and Sexual Activity  . Alcohol use: No    Alcohol/week: 0.0 oz  . Drug use: No  . Sexual activity: Not on file  Other Topics Concern  . Not on file  Social History Narrative  . Not on file    Family History  Problem Relation Age of Onset  . Heart attack Paternal Grandfather   . Colon cancer Neg Hx   . Colon polyps Neg Hx   . Kidney disease Neg Hx   . Esophageal cancer Neg Hx   . Diabetes Neg Hx   . Gallbladder disease Neg Hx   . Stomach cancer  Neg Hx     BP (!) 135/93   Pulse 66   Ht 6' (1.829 m)   Wt 185 lb (83.9 kg)   BMI 25.09 kg/m   Review of Systems: See HPI above.     Objective:  Physical Exam:  Gen: NAD, comfortable in exam room.  Right ankle: No gross deformity, swelling, ecchymoses Mild limitation plantarflexion and dorsiflexion.  5/5 strength all directions. No TTP Negative ant drawer and talar tilt.   Negative syndesmotic compression. Thompsons test negative. NV intact distally.  Assessment & Plan:  1. Right ankle injury - small avulsion distal fibula - clinically healed now.  S/p keflex, vitamin C.  Refilled his etodolac though mostly using it for other issues.  Stop ASO.  Continue strengthening for 4 more weeks - reviewed balance exercises also.  F/u prn.

## 2017-03-05 NOTE — Patient Instructions (Addendum)
You're doing great! Do the home exercises 2-3 times a week for 4 more weeks. Incorporate the balance exercises we discussed in those 4 weeks as well (single leg stance, reaches, toe touches) once a day. I sent in etodolac for you to take as needed. Expect some soreness and swelling by the end of the day - this is normal. Follow up with me as needed.

## 2017-03-05 NOTE — Assessment & Plan Note (Signed)
small avulsion distal fibula - clinically healed now.  S/p keflex, vitamin C.  Refilled his etodolac though mostly using it for other issues.  Stop ASO.  Continue strengthening for 4 more weeks - reviewed balance exercises also.  F/u prn.

## 2017-05-19 ENCOUNTER — Ambulatory Visit: Payer: BLUE CROSS/BLUE SHIELD | Admitting: Family Medicine

## 2017-05-19 ENCOUNTER — Encounter: Payer: Self-pay | Admitting: Family Medicine

## 2017-05-19 DIAGNOSIS — M25552 Pain in left hip: Secondary | ICD-10-CM | POA: Diagnosis not present

## 2017-05-19 HISTORY — DX: Pain in left hip: M25.552

## 2017-05-19 NOTE — Assessment & Plan Note (Signed)
2/2 proximal IT band syndrome.  Shown home exercises and stretches to do daily.  Icing, tylenol and his etodolac (could try aleve instead).  Consider physical therapy.  F/u in 5-6 weeks.

## 2017-05-19 NOTE — Patient Instructions (Addendum)
You have IT band syndrome Avoid painful activities as much as possible. Ice over area of pain 3-4 times a day for 15 minutes at a time Standing hip rotations and hip side raise exercise 3 sets of 10 once a day - add weights if these become too easy. Stretches - pick 2-3 and hold for 20-30 seconds x 3 - do once or twice a day. Tylenol and/or aleve as needed for pain. If not improving, can consider physical therapy. Follow up with me in 5-6 weeks if not improving as expected.

## 2017-05-19 NOTE — Progress Notes (Signed)
PCP: Ileana Ladd, MD  Subjective:   HPI: Patient is a 47 y.o. male here for left hip pain.  Patient denies known injury or trauma. He states about 1 1/2 weeks ago he developed lateral left hip pain. Does a lot of standing and walking at work, pain worse with these. Also worse lying on left side. No skin changes, numbness. Pain is sharp, better with rest. Takes etodolac but not helping for this. No radiation.  Past Medical History:  Diagnosis Date  . Dysuria-frequency syndrome   . GERD (gastroesophageal reflux disease)   . Hyperlipidemia   . Hypertension   . Inguinal hernia   . Insomnia   . Right ureteral stone     Current Outpatient Medications on File Prior to Visit  Medication Sig Dispense Refill  . esomeprazole (NEXIUM) 40 MG capsule Take 1 capsule (40 mg total) by mouth daily at 12 noon. 90 capsule 0  . etodolac (LODINE) 500 MG tablet Take 1 tablet (500 mg total) by mouth 2 (two) times daily. 60 tablet 1  . lidocaine (XYLOCAINE) 5 % ointment Use 1 application 3-4 times daily for anal pain. 35.44 g 1  . pravastatin (PRAVACHOL) 20 MG tablet Take 20 mg by mouth daily.    . trandolapril-verapamil (TARKA) 4-240 MG per tablet TAKE 1 TABLET EVERY DAY 90 tablet 3  . zolpidem (AMBIEN) 10 MG tablet TAKE 1 TABLET BY MOUTH AT BEDTIME 30 tablet 3   No current facility-administered medications on file prior to visit.     Past Surgical History:  Procedure Laterality Date  . CYSTOSCOPY WITH RETROGRADE PYELOGRAM, URETEROSCOPY AND STENT PLACEMENT Right 12/06/2013   Procedure: CYSTOSCOPY WITH RETROGRADE PYELOGRAM, URETEROSCOPY AND STONE BASKETRY;  Surgeon: Anner Crete, MD;  Location: [facility];  Service: Urology;  Laterality: Right;  . HERNIA REPAIR     left inguinal repair  . TONSILLECTOMY      Allergies  Allergen Reactions  . Hydrocodone Hives and Nausea And Vomiting  . Morphine And Related Nausea And Vomiting    If given too much    Social History    Socioeconomic History  . Marital status: Married    Spouse name: Not on file  . Number of children: 0  . Years of education: Not on file  . Highest education level: Not on file  Occupational History  . Occupation: Therapist, sports  Social Needs  . Financial resource strain: Not on file  . Food insecurity:    Worry: Not on file    Inability: Not on file  . Transportation needs:    Medical: Not on file    Non-medical: Not on file  Tobacco Use  . Smoking status: Never Smoker  . Smokeless tobacco: Never Used  Substance and Sexual Activity  . Alcohol use: No    Alcohol/week: 0.0 oz  . Drug use: No  . Sexual activity: Not on file  Lifestyle  . Physical activity:    Days per week: Not on file    Minutes per session: Not on file  . Stress: Not on file  Relationships  . Social connections:    Talks on phone: Not on file    Gets together: Not on file    Attends religious service: Not on file    Active member of club or organization: Not on file    Attends meetings of clubs or organizations: Not on file    Relationship status: Not on file  . Intimate partner violence:  Fear of current or ex partner: Not on file    Emotionally abused: Not on file    Physically abused: Not on file    Forced sexual activity: Not on file  Other Topics Concern  . Not on file  Social History Narrative  . Not on file    Family History  Problem Relation Age of Onset  . Heart attack Paternal Grandfather   . Colon cancer Neg Hx   . Colon polyps Neg Hx   . Kidney disease Neg Hx   . Esophageal cancer Neg Hx   . Diabetes Neg Hx   . Gallbladder disease Neg Hx   . Stomach cancer Neg Hx     BP (!) 147/89   Pulse (!) 103   Ht 6' (1.829 m)   Wt 185 lb (83.9 kg)   BMI 25.09 kg/m   Review of Systems: See HPI above.     Objective:  Physical Exam:  Gen: NAD, comfortable in exam room  Back: No gross deformity, scoliosis. No paraspinal TTP.  No midline or bony TTP. FROM without  pain. Strength LEs 5/5 all muscle groups.   Negative SLRs. Sensation intact to light touch bilaterally.  Left hip: No deformity. TTP in tensor fascia lata.  No trochanteric bursa, other tenderness. FROM with 5/5 strength including hip abduction. Negative logroll. Negative fabers and piriformis stretch. NVI distally.   Assessment & Plan:  1. Left hip pain - 2/2 proximal IT band syndrome.  Shown home exercises and stretches to do daily.  Icing, tylenol and his etodolac (could try aleve instead).  Consider physical therapy.  F/u in 5-6 weeks.

## 2017-06-28 ENCOUNTER — Other Ambulatory Visit: Payer: Self-pay | Admitting: Family Medicine

## 2017-10-09 ENCOUNTER — Emergency Department (HOSPITAL_BASED_OUTPATIENT_CLINIC_OR_DEPARTMENT_OTHER)
Admission: EM | Admit: 2017-10-09 | Discharge: 2017-10-09 | Disposition: A | Discharge status: Home / Self Care | Payer: BLUE CROSS/BLUE SHIELD | Attending: Emergency Medicine | Admitting: Emergency Medicine

## 2017-10-09 ENCOUNTER — Emergency Department (HOSPITAL_BASED_OUTPATIENT_CLINIC_OR_DEPARTMENT_OTHER): Payer: BLUE CROSS/BLUE SHIELD

## 2017-10-09 ENCOUNTER — Encounter (HOSPITAL_BASED_OUTPATIENT_CLINIC_OR_DEPARTMENT_OTHER): Payer: Self-pay | Admitting: Emergency Medicine

## 2017-10-09 ENCOUNTER — Other Ambulatory Visit: Payer: Self-pay

## 2017-10-09 DIAGNOSIS — Z79899 Other long term (current) drug therapy: Secondary | ICD-10-CM | POA: Insufficient documentation

## 2017-10-09 DIAGNOSIS — R1031 Right lower quadrant pain: Secondary | ICD-10-CM | POA: Diagnosis present

## 2017-10-09 DIAGNOSIS — I10 Essential (primary) hypertension: Secondary | ICD-10-CM | POA: Insufficient documentation

## 2017-10-09 DIAGNOSIS — R109 Unspecified abdominal pain: Secondary | ICD-10-CM

## 2017-10-09 LAB — URINALYSIS, ROUTINE W REFLEX MICROSCOPIC
BILIRUBIN URINE: NEGATIVE
Glucose, UA: NEGATIVE mg/dL
Hgb urine dipstick: NEGATIVE
Ketones, ur: NEGATIVE mg/dL
LEUKOCYTES UA: NEGATIVE
NITRITE: NEGATIVE
Protein, ur: NEGATIVE mg/dL
Specific Gravity, Urine: 1.01 (ref 1.005–1.030)
pH: 5.5 (ref 5.0–8.0)

## 2017-10-09 MED ORDER — ONDANSETRON HCL 4 MG/2ML IJ SOLN
4.0000 mg | Freq: Once | INTRAMUSCULAR | Status: AC
  Administered 2017-10-09: 4 mg via INTRAVENOUS
  Filled 2017-10-09: qty 2

## 2017-10-09 MED ORDER — HYDROMORPHONE HCL 1 MG/ML IJ SOLN
1.0000 mg | Freq: Once | INTRAMUSCULAR | Status: AC
Start: 2017-10-09 — End: 2017-10-09
  Administered 2017-10-09: 1 mg via INTRAVENOUS
  Filled 2017-10-09: qty 1

## 2017-10-09 MED ORDER — ONDANSETRON 8 MG PO TBDP: 8.0000 mg | ORAL_TABLET | Freq: Three times a day (TID) | ORAL | 0 refills | Status: DC | PRN

## 2017-10-09 MED ORDER — HYDROMORPHONE HCL 2 MG PO TABS: 2.0000 mg | ORAL_TABLET | ORAL | 0 refills | Status: DC | PRN

## 2017-10-09 MED FILL — ONDANSETRON ODT 8 MG TABLET: 8 | 4 days supply | Qty: 10 | Fill #0

## 2017-10-09 MED FILL — HYDROmorphone HCL 2 MG TABS: 2 | 2 days supply | Qty: 12 | Fill #0

## 2017-10-09 NOTE — ED Triage Notes (Signed)
Pt is c/o right flank pain that started a couple days ago and has progressively gotten worse  Pt has nausea and vomiting today  Pt has hx of kidney stones

## 2017-10-09 NOTE — ED Notes (Signed)
Pt waiting for wife to pick him up for transportation.

## 2017-10-09 NOTE — ED Notes (Signed)
Returned from CT.

## 2017-10-09 NOTE — ED Notes (Signed)
Patient transported to CT 

## 2017-10-09 NOTE — ED Notes (Signed)
Wife is here to pick up patient

## 2017-10-09 NOTE — ED Provider Notes (Signed)
[facility] Provider Note: Lowella DellJ. Lane Naleyah Ohlinger, MD, FACEP  CSN: [id number] MRN: [id number] ARRIVAL: 10/09/17 at 0548 ROOM: [facility]09/[facility]09   CHIEF COMPLAINT  Flank Pain   HISTORY OF PRESENT ILLNESS  10/09/17 6:02 AM Russell Gregory is a 47 y.o. male with a history of ureterolithiasis.  He is here with right-sided flank pain that is been stuttering for the past 2 to 3 days but became severe overnight.  The pain has been associated with nausea and vomiting.  He has had decreased urine output but has not noticed hematuria.  The pain is similar to previous kidney stones and is not significantly changed with movement.   Past Medical History:  Diagnosis Date  . Dysuria-frequency syndrome   . GERD (gastroesophageal reflux disease)   . Hyperlipidemia   . Hypertension   . Inguinal hernia   . Insomnia   . Right ureteral stone     Past Surgical History:  Procedure Laterality Date  . CYSTOSCOPY WITH RETROGRADE PYELOGRAM, URETEROSCOPY AND STENT PLACEMENT Right 12/06/2013   Procedure: CYSTOSCOPY WITH RETROGRADE PYELOGRAM, URETEROSCOPY AND STONE BASKETRY;  Surgeon: Anner CreteJohn J Wrenn, MD;  [facility];  Service: Urology;  Laterality: Right;  . HERNIA REPAIR     left inguinal repair  . TONSILLECTOMY      Family History  Problem Relation Age of Onset  . Heart attack Paternal Grandfather   . Colon cancer Neg Hx   . Colon polyps Neg Hx   . Kidney disease Neg Hx   . Esophageal cancer Neg Hx   . Diabetes Neg Hx   . Gallbladder disease Neg Hx   . Stomach cancer Neg Hx     Social History   Tobacco Use  . Smoking status: Never Smoker  . Smokeless tobacco: Never Used  Substance Use Topics  . Alcohol use: No    Alcohol/week: 0.0 standard drinks  . Drug use: No    Prior to Admission medications   Medication Sig Start Date End Date Taking? Authorizing Provider  esomeprazole (NEXIUM) 40 MG capsule Take 1 capsule (40 mg total) by mouth daily at 12 noon. 03/14/14   Yes Pyrtle, Carie CaddyJay M, MD  pravastatin (PRAVACHOL) 20 MG tablet Take 20 mg by mouth daily. 03/12/14  Yes [provider]  trandolapril-verapamil Jolene Provost(TARKA) 4-240 MG per tablet TAKE 1 TABLET EVERY DAY 03/01/13  Yes Ileana LaddWong, Francis P, MD  zolpidem (AMBIEN) 10 MG tablet TAKE 1 TABLET BY MOUTH AT BEDTIME 06/30/13  Yes Ileana LaddWong, Francis P, MD  etodolac (LODINE) 500 MG tablet Take 1 tablet (500 mg total) by mouth 2 (two) times daily. 03/05/17   Hudnall, Azucena FallenShane R, MD  lidocaine (XYLOCAINE) 5 % ointment Use 1 application 3-4 times daily for anal pain. 10/10/16   Esterwood, Amy S, PA-C    Allergies Hydrocodone and Morphine and related   REVIEW OF SYSTEMS  Negative except as noted here or in the History of Present Illness.   PHYSICAL EXAMINATION  Initial Vital Signs Blood pressure (!) 140/99, pulse 66, temperature 97.9 F (36.6 C), temperature source Oral, resp. rate 18, SpO2 100 %.  Examination General: Well-developed, well-nourished male in no acute distress; appearance consistent with age of record HENT: normocephalic; atraumatic Eyes: pupils equal, round and reactive to light; extraocular muscles intact Neck: supple Heart: regular rate and rhythm Lungs: clear to auscultation bilaterally Abdomen: soft; nondistended; nontender; bowel sounds present GU: Right CVA tenderness Extremities: No deformity; full range of motion; pulses normal Neurologic: Awake, alert and  oriented; motor function intact in all extremities and symmetric; no facial droop Skin: Warm and dry Psychiatric: Normal mood and affect   RESULTS  Summary of this visit's results, reviewed by myself:   EKG Interpretation  Date/Time:    Ventricular Rate:    PR Interval:    QRS Duration:   QT Interval:    QTC Calculation:   R Axis:     Text Interpretation:        Laboratory Studies: Results for orders placed or performed during the hospital encounter of 10/09/17 (from the past 24 hour(s))  Urinalysis, Routine w reflex  microscopic     Status: None   Collection Time: 10/09/17  6:02 AM  Result Value Ref Range   Color, Urine YELLOW YELLOW   APPearance CLEAR CLEAR   Specific Gravity, Urine 1.010 1.005 - 1.030   pH 5.5 5.0 - 8.0   Glucose, UA NEGATIVE NEGATIVE mg/dL   Hgb urine dipstick NEGATIVE NEGATIVE   Bilirubin Urine NEGATIVE NEGATIVE   Ketones, ur NEGATIVE NEGATIVE mg/dL   Protein, ur NEGATIVE NEGATIVE mg/dL   Nitrite NEGATIVE NEGATIVE   Leukocytes, UA NEGATIVE NEGATIVE   Imaging Studies: Ct Renal Stone Study  Result Date: 10/09/2017 CLINICAL DATA:  Right flank pain for 2 days, increasing today. Nausea and vomiting today. Previous history of renal stones. EXAM: CT ABDOMEN AND PELVIS WITHOUT CONTRAST TECHNIQUE: Multidetector CT imaging of the abdomen and pelvis was performed following the standard protocol without IV contrast. COMPARISON:  12/01/2013 FINDINGS: Lower chest: Dependent atelectasis in the lung bases. Coronary artery calcifications. Hepatobiliary: No focal liver abnormality is seen. No gallstones, gallbladder wall thickening, or biliary dilatation. Pancreas: Unremarkable. No pancreatic ductal dilatation or surrounding inflammatory changes. Spleen: Normal in size without focal abnormality. Adrenals/Urinary Tract: No adrenal gland nodules. 2 mm stone in the lower pole left kidney. 3 mm stone in the upper pole right kidney. No hydronephrosis or hydroureter. No ureteral or bladder stones are seen. Bladder wall is not thickened. Stomach/Bowel: Stomach, small bowel, and colon are not abnormally distended. No wall thickening is appreciated although under distention limits evaluation of bowel wall. Scattered diverticula in the sigmoid colon without evidence of diverticulitis. The appendix is normal. Vascular/Lymphatic: Scattered calcification in the abdominal aorta. No aneurysm. No significant lymphadenopathy. Reproductive: Prostate gland is mildly enlarged, measuring 4.3 cm diameter. Other: No free air or  free fluid in the abdomen. Small right inguinal hernia containing fat. No bowel herniation. Musculoskeletal: No acute or significant osseous findings. IMPRESSION: Bilateral nonobstructing intrarenal stones. No ureteral stone or obstruction demonstrated. Electronically Signed   By: Burman NievesWilliam  Stevens M.D.   On: 10/09/2017 06:45    ED COURSE and MDM  Nursing notes and initial vitals signs, including pulse oximetry, reviewed.  Vitals:   10/09/17 0600 10/09/17 0602  BP: (!) 140/99   Pulse: 66   Resp: 18   Temp: 97.9 F (36.6 C)   TempSrc: Oral   SpO2: 100%   Weight:  86.2 kg  Height:  6' (1.829 m)   6:56 AM Patient pain-free.  CT shows no ureteral stone and urinalysis is negative for blood.  Patient's symptoms are consistent with a passed ureteral stone.   PROCEDURES    ED DIAGNOSES     ICD-10-CM   1. Right flank pain R10.9        Keiona Jenison, Jonny RuizJohn, MD 10/09/17 [phone]

## 2018-07-15 ENCOUNTER — Encounter: Payer: Self-pay | Admitting: Podiatry

## 2018-07-15 ENCOUNTER — Ambulatory Visit: Payer: BLUE CROSS/BLUE SHIELD | Admitting: Podiatry

## 2018-07-15 ENCOUNTER — Other Ambulatory Visit: Payer: Self-pay

## 2018-07-15 VITALS — BP 161/97 | HR 67 | Temp 97.7°F

## 2018-07-15 DIAGNOSIS — L600 Ingrowing nail: Secondary | ICD-10-CM | POA: Diagnosis not present

## 2018-07-15 NOTE — Patient Instructions (Signed)

## 2018-07-15 NOTE — Progress Notes (Signed)
Subjective:   Patient ID: Russell Gregory, male   DOB: 48 y.o.   MRN: [id number]   HPI Patient presents stating he traumatized his left big nail and it is been very sore and partially off and he cannot wear shoe with it.  Patient does not smoke and is active   Review of Systems  All other systems reviewed and are negative.       Objective:  Physical Exam Vitals signs and nursing note reviewed.  Constitutional:      Appearance: He is well-developed.  Pulmonary:     Effort: Pulmonary effort is normal.  Musculoskeletal: Normal range of motion.  Skin:    General: Skin is warm.  Neurological:     Mental Status: He is alert.     Neurovascular status found to be intact muscle strength adequate range of motion within normal limits with patient noted to have a traumatized left hallux nail with the distal two thirds of the nailbed being partially off with incurvation of the corner and history of ingrown.  Also yellow discoloration of the nailbed is noted.  Patient has good digital perfusion well oriented x3     Assessment:  Traumatized left hallux nail with quite a bit of pain upon palpation with discoloration of the bed     Plan:  H&P reviewed condition and recommended nail removal with allowing new nail to regrow.  Explained it may not regrow normally and ultimately may require permanent procedure or ingrown toenail procedure and patient understands this and wants to have the procedure.  Today I infiltrated the left hallux 60 mg like Marcaine mixture sterile prep applied to the toe and using sterile instrumentation I remove the left hallux nail I flushed the bed I did noted a small tear within the bed itself but it was superficial so did not stitches and applied sterile dressing.  Reappoint to recheck with instructions on soaks

## 2019-06-07 ENCOUNTER — Encounter: Payer: Self-pay | Admitting: Cardiology

## 2019-06-07 ENCOUNTER — Ambulatory Visit: Payer: BC Managed Care – PPO | Admitting: Cardiology

## 2019-06-07 ENCOUNTER — Other Ambulatory Visit: Payer: Self-pay

## 2019-06-07 VITALS — BP 142/92 | HR 67 | Ht 72.0 in | Wt 192.8 lb

## 2019-06-07 DIAGNOSIS — Z8249 Family history of ischemic heart disease and other diseases of the circulatory system: Secondary | ICD-10-CM | POA: Diagnosis not present

## 2019-06-07 DIAGNOSIS — I10 Essential (primary) hypertension: Secondary | ICD-10-CM

## 2019-06-07 DIAGNOSIS — R072 Precordial pain: Secondary | ICD-10-CM

## 2019-06-07 DIAGNOSIS — E782 Mixed hyperlipidemia: Secondary | ICD-10-CM | POA: Diagnosis not present

## 2019-06-07 MED ORDER — CARVEDILOL 6.25 MG PO TABS: 6.2500 mg | ORAL_TABLET | Freq: Two times a day (BID) | ORAL | 2 refills | Status: DC

## 2019-06-07 MED ORDER — LISINOPRIL 40 MG PO TABS: 40.0000 mg | ORAL_TABLET | Freq: Every day | ORAL | 2 refills | Status: DC

## 2019-06-07 MED ORDER — ASPIRIN EC 81 MG PO TBEC: 81.0000 mg | DELAYED_RELEASE_TABLET | Freq: Every day | ORAL | 3 refills | Status: AC

## 2019-06-07 NOTE — Progress Notes (Signed)
Cardiology Office Note:    Date:  06/07/2019   ID:  Russell Gregory, DOB [birth year], MRN [id number]  PCP:  Ileana Ladd, MD  Cardiologist:  No primary care provider on file.  Electrophysiologist:  None   Referring MD: Ileana Ladd, MD   Chief complain: chest pain  History of Present Illness:    Russell Gregory is a 49 y.o. male with strong family history of premature CAD, HLP, hypertension who is coming after years.  He was seen in 2016 for chest pain and underwent exercise treadmill stress test that was negative for ischemia.  He continues to have episodes of what he calls indigestion, he walks a lot at work and does not have chest pain with that but occasionally gets short of breath when he gets emotionally stressed.  He has very significant family history of premature coronary artery disease in multiple cousins, and most recently his 57 year old brother died in 10/20/20of myocardial infarction.  He has been feeling more short of breath and lightheaded ever since he got the Covid vaccine in February.  Past Medical History:  Diagnosis Date  . Dysuria-frequency syndrome   . GERD (gastroesophageal reflux disease)   . Hyperlipidemia   . Hypertension   . Inguinal hernia   . Insomnia   . Right ureteral stone     Past Surgical History:  Procedure Laterality Date  . CYSTOSCOPY WITH RETROGRADE PYELOGRAM, URETEROSCOPY AND STENT PLACEMENT Right 12/06/2013   Procedure: CYSTOSCOPY WITH RETROGRADE PYELOGRAM, URETEROSCOPY AND STONE BASKETRY;  Surgeon: Anner Crete, MD;  Location: [facility];  Service: Urology;  Laterality: Right;  . HERNIA REPAIR     left inguinal repair  . TONSILLECTOMY      Current Medications: Current Meds  Medication Sig  . esomeprazole (NEXIUM) 20 MG capsule Take 20 mg by mouth as needed.  . etodolac (LODINE) 500 MG tablet Take 500 mg by mouth 2 (two) times daily as needed.  . simvastatin (ZOCOR) 10 MG tablet SMARTSIG:1 Tablet(s) By  Mouth Every Evening  . zolpidem (AMBIEN) 10 MG tablet TAKE 1 TABLET BY MOUTH AT BEDTIME  . [DISCONTINUED] fenofibrate 54 MG tablet Take 54 mg by mouth daily. with food  . [DISCONTINUED] lisinopril (ZESTRIL) 20 MG tablet Take 20 mg by mouth daily.  . [DISCONTINUED] verapamil (CALAN-SR) 240 MG CR tablet Take 240 mg by mouth daily.     Allergies:   Hydrocodone and Morphine and related   Social History   Socioeconomic History  . Marital status: Married    Spouse name: Not on file  . Number of children: 0  . Years of education: Not on file  . Highest education level: Not on file  Occupational History  . Occupation: Machinest  Tobacco Use  . Smoking status: Never Smoker  . Smokeless tobacco: Never Used  Substance and Sexual Activity  . Alcohol use: No    Alcohol/week: 0.0 standard drinks  . Drug use: No  . Sexual activity: Not on file  Other Topics Concern  . Not on file  Social History Narrative  . Not on file   Social Determinants of Health   Financial Resource Strain:   . Difficulty of Paying Living Expenses:   Food Insecurity:   . Worried About Programme researcher, broadcasting/film/video in the Last Year:   . Barista in the Last Year:   Transportation Needs:   . Freight forwarder (Medical):   [name] Lack of Transportation (Non-Medical):  Physical Activity:   . Days of Exercise per Week:   . Minutes of Exercise per Session:   Stress:   . Feeling of Stress :   Social Connections:   . Frequency of Communication with Friends and Family:   . Frequency of Social Gatherings with Friends and Family:   . Attends Religious Services:   . Active Member of Clubs or Organizations:   . Attends Banker Meetings:   [name] Marital Status:      Family History: The patient's family history includes Heart attack in his paternal grandfather. There is no history of Colon cancer, Colon polyps, Kidney disease, Esophageal cancer, Diabetes, Gallbladder disease, or Stomach cancer.  ROS:    Please see the history of present illness.     All other systems reviewed and are negative.  EKGs/Labs/Other Studies Reviewed:    The following studies were reviewed today:  EKG:  EKG is ordered today.  The ekg ordered today demonstrates normal sinus rhythm, normal EKG.  Recent Labs: No results found for requested labs within last 8760 hours.  Recent Lipid Panel    Component Value Date/Time   CHOL 207 (H) 06/30/2013 0902   CHOL 151 07/23/2012 1059   TRIG 129 06/30/2013 0902   TRIG 114 10/29/2012 1048   TRIG 126 07/23/2012 1059   HDL 39 (L) 06/30/2013 0902   HDL 44 10/29/2012 1048   HDL 40 07/23/2012 1059   CHOLHDL 5.3 (H) 06/30/2013 0902   LDLCALC 142 (H) 06/30/2013 0902   LDLCALC 102 (H) 10/29/2012 1048   LDLCALC 86 07/23/2012 1059    Physical Exam:    VS:  BP (!) 142/92   Pulse 67   Ht 6' (1.829 m)   Wt 192 lb 12.8 oz (87.5 kg)   SpO2 99%   BMI 26.15 kg/m     Wt Readings from Last 3 Encounters:  06/07/19 192 lb 12.8 oz (87.5 kg)  10/09/17 190 lb (86.2 kg)  05/19/17 185 lb (83.9 kg)     GEN:  Well nourished, well developed in no acute distress HEENT: Normal NECK: No JVD; No carotid bruits LYMPHATICS: No lymphadenopathy CARDIAC: RRR, no murmurs, rubs, gallops RESPIRATORY:  Clear to auscultation without rales, wheezing or rhonchi  ABDOMEN: Soft, non-tender, non-distended MUSCULOSKELETAL:  No edema; No deformity  SKIN: Warm and dry NEUROLOGIC:  Alert and oriented x 3 PSYCHIATRIC:  Normal affect   ASSESSMENT:    1. Mixed hyperlipidemia   2. Essential hypertension   3. Family history of early CAD   4. Precordial pain    PLAN:    In order of problems listed above:  1. Chest pain -we will obtain coronary CTA in the settings of control hypertension hyperlipidemia and significant family history of premature coronary artery disease.  Continue aspirin, we will tailor his lipid management based on CT results. 2. Hyperlipidemia -most recent results HDL 45, LDL  142 and triglycerides 160, normal LFTs, hemoglobin A1c 5.5. 3. Hypertension -I will increase increase lisinopril 40 mg daily, discontinue verapamil, start carvedilol 6.25 mg p.o. twice daily. 4. Follow-up in 2 months.   Medication Adjustments/Labs and Tests Ordered: Current medicines are reviewed at length with the patient today.  Concerns regarding medicines are outlined above.  Orders Placed This Encounter  Procedures  . CT CORONARY MORPH W/CTA COR W/SCORE W/CA W/CM &/OR WO/CM  . CT CORONARY FRACTIONAL FLOW RESERVE DATA PREP  . CT CORONARY FRACTIONAL FLOW RESERVE FLUID ANALYSIS  . Basic metabolic panel  . EKG  12-Lead   Meds ordered this encounter  Medications  . aspirin EC 81 MG tablet    Sig: Take 1 tablet (81 mg total) by mouth daily.    Dispense:  90 tablet    Refill:  3  . carvedilol (COREG) 6.25 MG tablet    Sig: Take 1 tablet (6.25 mg total) by mouth 2 (two) times daily.    Dispense:  180 tablet    Refill:  2  . lisinopril (ZESTRIL) 40 MG tablet    Sig: Take 1 tablet (40 mg total) by mouth daily.    Dispense:  90 tablet    Refill:  2    increased    Patient Instructions  Medication Instructions:   STOP TAKING VERAPAMIL   STOP TAKING FENOFIBRATE  INCREASE YOUR LISINOPRIL TO 40 MG BY MOUTH DAILY  START TAKING CARVEDILOL 6.25 MG BY MOUTH TWICE DAILY  CONTINUE TAKING ASPIRIN  81 MG BY MOUTH DAILY  *If you need a refill on your cardiac medications before your next appointment, please call your pharmacy*   Testing/Procedures:  Your cardiac CT will be scheduled at one of the below locations:   [facility] [address] [place], [place] [address] [phone]   If scheduled at [facility] please arrive at the [facility] main entrance of [facility] 30 minutes prior to test start time. Proceed to the [facility] Radiology Department (first floor) to check-in and test prep.  Please follow these instructions carefully (unless  otherwise directed):  On the Night Before the Test: . Be sure to Drink plenty of water. . Do not consume any caffeinated/decaffeinated beverages or chocolate 12 hours prior to your test. . Do not take any antihistamines 12 hours prior to your test.  On the Day of the Test: . Drink plenty of water. Do not drink any water within one hour of the test. . Do not eat any food 4 hours prior to the test. . You may take your regular medications prior to the test.  Take YOUR CARVEDILOL AS PRESCRIBED       After the Test: . Drink plenty of water. . After receiving IV contrast, you may experience a mild flushed feeling. This is normal. . On occasion, you may experience a mild rash up to 24 hours after the test. This is not dangerous. If this occurs, you can take Benadryl 25 mg and increase your fluid intake. . If you experience trouble breathing, this can be serious. If it is severe call 911 IMMEDIATELY. If it is mild, please call our office.  Once we have confirmed authorization from your insurance company, we will call you to set up a date and time for your test.   For non-scheduling related questions, please contact the cardiac imaging nurse navigator should you have any questions/concerns: Marchia Bond, RN Navigator Cardiac Imaging [facility] [phone] office  For scheduling needs, including cancellations and rescheduling, please call [phone].    Follow-Up:  2-3 MONTHS WITH DR. Johann Capers MAY ADD TO ANY QUARTER DAY END SLOT PER DR. Kate Sable, Ena Dawley, MD  06/07/2019 9:57 AM    [place]

## 2019-06-07 NOTE — Patient Instructions (Signed)
Medication Instructions:   STOP TAKING VERAPAMIL   STOP TAKING FENOFIBRATE  INCREASE YOUR LISINOPRIL TO 40 MG BY MOUTH DAILY  START TAKING CARVEDILOL 6.25 MG BY MOUTH TWICE DAILY  CONTINUE TAKING ASPIRIN  81 MG BY MOUTH DAILY  *If you need a refill on your cardiac medications before your next appointment, please call your pharmacy*   Testing/Procedures:  Your cardiac CT will be scheduled at one of the below locations:   [facility] [address] [place], [place] [address] [phone]   If scheduled at [facility], please arrive at the [facility] main entrance of [facility] 30 minutes prior to test start time. Proceed to the [facility] Radiology Department (first floor) to check-in and test prep.  Please follow these instructions carefully (unless otherwise directed):  On the Night Before the Test: . Be sure to Drink plenty of water. . Do not consume any caffeinated/decaffeinated beverages or chocolate 12 hours prior to your test. . Do not take any antihistamines 12 hours prior to your test.  On the Day of the Test: . Drink plenty of water. Do not drink any water within one hour of the test. . Do not eat any food 4 hours prior to the test. . You may take your regular medications prior to the test.  Take YOUR CARVEDILOL AS PRESCRIBED       After the Test: . Drink plenty of water. . After receiving IV contrast, you may experience a mild flushed feeling. This is normal. . On occasion, you may experience a mild rash up to 24 hours after the test. This is not dangerous. If this occurs, you can take Benadryl 25 mg and increase your fluid intake. . If you experience trouble breathing, this can be serious. If it is severe call 911 IMMEDIATELY. If it is mild, please call our office.  Once we have confirmed authorization from your insurance company, we will call you to set up a date and time for your test.   For non-scheduling related questions,  please contact the cardiac imaging nurse navigator should you have any questions/concerns: Rockwell Alexandria, RN Navigator Cardiac Imaging [facility] [phone] office  For scheduling needs, including cancellations and rescheduling, please call [phone].    Follow-Up:  2-3 MONTHS WITH DR. Johnell Comings MAY ADD TO ANY QUARTER DAY END SLOT PER DR. Delton See

## 2019-07-11 ENCOUNTER — Telehealth (HOSPITAL_COMMUNITY): Payer: Self-pay | Admitting: *Deleted

## 2019-07-11 NOTE — Telephone Encounter (Signed)
Attempted to call patient regarding upcoming cardiac CT appointment. Left message on voicemail with name and callback number  Taeden Geller Tai RN Navigator Cardiac Imaging [facility] [phone] Office [phone] Cell  

## 2019-07-11 NOTE — Telephone Encounter (Signed)
Pt returning call regarding Cardiac CT scan.  Instructions reviewed and pt expressed understanding.

## 2019-07-12 ENCOUNTER — Other Ambulatory Visit: Payer: Self-pay

## 2019-07-12 ENCOUNTER — Ambulatory Visit (HOSPITAL_COMMUNITY)
Admission: RE | Admit: 2019-07-12 | Discharge: 2019-07-12 | Disposition: A | Discharge status: Home / Self Care | Payer: BC Managed Care – PPO | Source: Ambulatory Visit | Attending: Cardiology | Admitting: Cardiology

## 2019-07-12 DIAGNOSIS — R072 Precordial pain: Secondary | ICD-10-CM | POA: Diagnosis present

## 2019-07-12 DIAGNOSIS — I2510 Atherosclerotic heart disease of native coronary artery without angina pectoris: Secondary | ICD-10-CM | POA: Diagnosis not present

## 2019-07-12 DIAGNOSIS — Z8249 Family history of ischemic heart disease and other diseases of the circulatory system: Secondary | ICD-10-CM | POA: Diagnosis present

## 2019-07-12 DIAGNOSIS — E782 Mixed hyperlipidemia: Secondary | ICD-10-CM

## 2019-07-12 DIAGNOSIS — I10 Essential (primary) hypertension: Secondary | ICD-10-CM | POA: Diagnosis present

## 2019-07-12 MED ORDER — IOHEXOL 350 MG/ML SOLN
80.0000 mL | Freq: Once | INTRAVENOUS | Status: AC | PRN
  Administered 2019-07-12: 80 mL via INTRAVENOUS

## 2019-07-12 MED ORDER — NITROGLYCERIN 0.4 MG SL SUBL
0.8000 mg | SUBLINGUAL_TABLET | Freq: Once | SUBLINGUAL | Status: AC
  Administered 2019-07-12: 0.8 mg via SUBLINGUAL

## 2019-07-12 MED ORDER — NITROGLYCERIN 0.4 MG SL SUBL
SUBLINGUAL_TABLET | SUBLINGUAL | Status: AC
  Filled 2019-07-12: qty 2

## 2019-07-13 ENCOUNTER — Ambulatory Visit (HOSPITAL_COMMUNITY)
Admission: RE | Admit: 2019-07-13 | Discharge: 2019-07-13 | Disposition: A | Discharge status: Home / Self Care | Payer: BC Managed Care – PPO | Source: Ambulatory Visit | Attending: Cardiology | Admitting: Cardiology

## 2019-07-13 DIAGNOSIS — Z8249 Family history of ischemic heart disease and other diseases of the circulatory system: Secondary | ICD-10-CM | POA: Insufficient documentation

## 2019-07-13 DIAGNOSIS — I2510 Atherosclerotic heart disease of native coronary artery without angina pectoris: Secondary | ICD-10-CM | POA: Insufficient documentation

## 2019-07-13 DIAGNOSIS — E782 Mixed hyperlipidemia: Secondary | ICD-10-CM | POA: Diagnosis not present

## 2019-07-13 DIAGNOSIS — R072 Precordial pain: Secondary | ICD-10-CM | POA: Insufficient documentation

## 2019-07-13 DIAGNOSIS — I10 Essential (primary) hypertension: Secondary | ICD-10-CM | POA: Insufficient documentation

## 2019-07-14 ENCOUNTER — Telehealth: Payer: Self-pay | Admitting: Cardiology

## 2019-07-14 NOTE — Telephone Encounter (Signed)
New Message    Pt is calling to speak with Ivy  Transferred to [employer]

## 2019-07-15 IMAGING — CT CT RENAL STONE PROTOCOL
2 of 4 series · 16 of 46 positions shown, 18 images · non-contrast
Comparison: 12/01/2013

CLINICAL DATA: Right flank pain for 2 days, increasing today.
Nausea and vomiting today. Previous history of renal stones.

EXAM:
CT ABDOMEN AND PELVIS WITHOUT CONTRAST
TECHNIQUE: Multidetector CT imaging of the abdomen and pelvis was performed
following the standard protocol without IV contrast.

[Series 2: axial st · axial · 0.79mm/px · z∈[+648,+1133]mm · 13 of 107 slices shown, 15 images]
[im 5/107  soft-tissue]
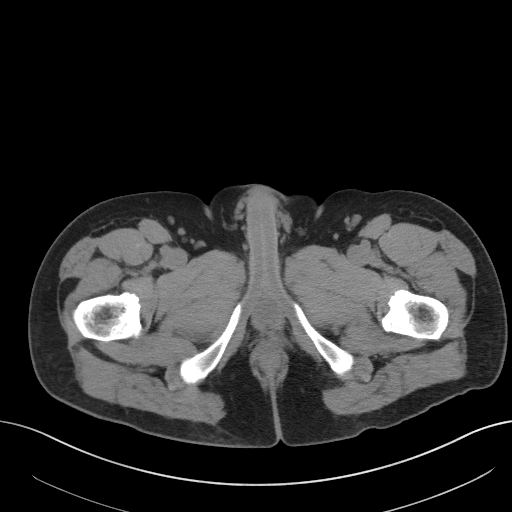
[im 5/107  bone]
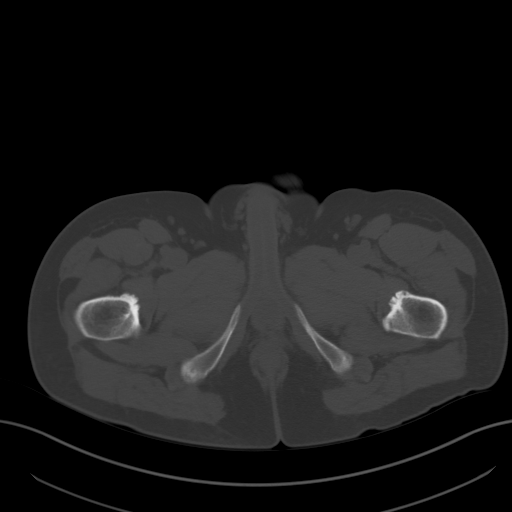
[im 13/107  soft-tissue]
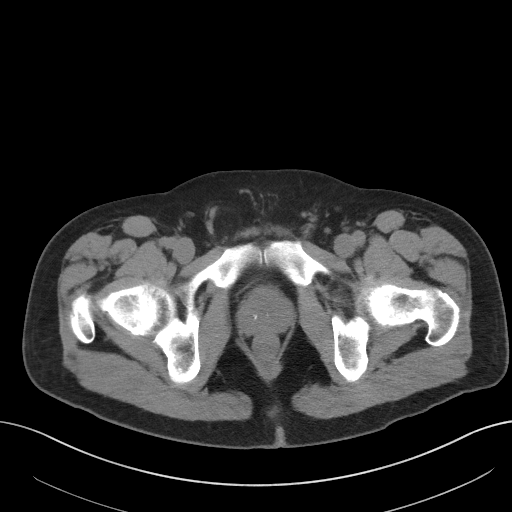
[im 21/107  soft-tissue]
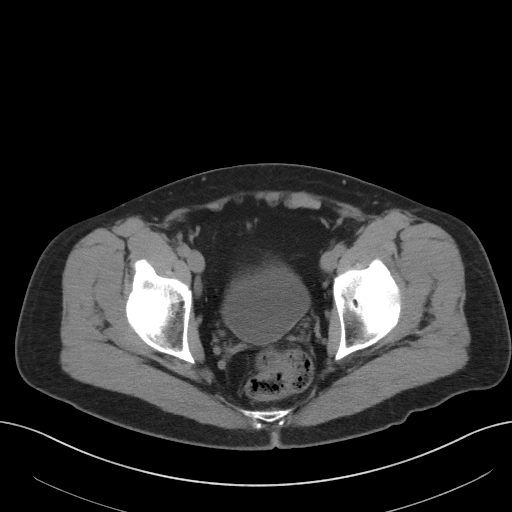
[im 29/107  soft-tissue]
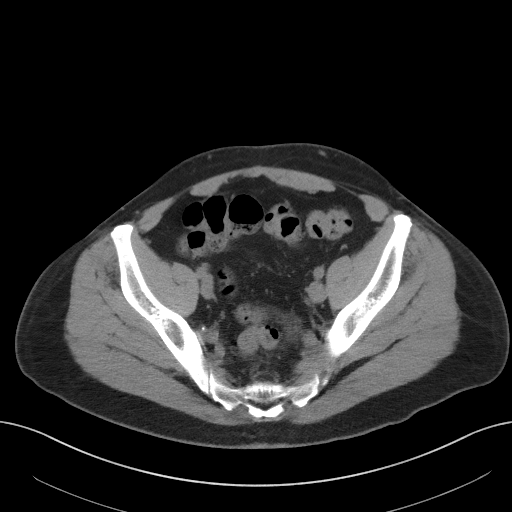
[im 37/107  soft-tissue]
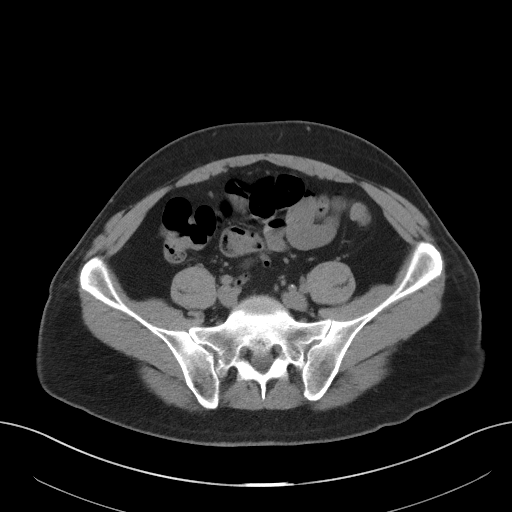
[im 45/107  soft-tissue]
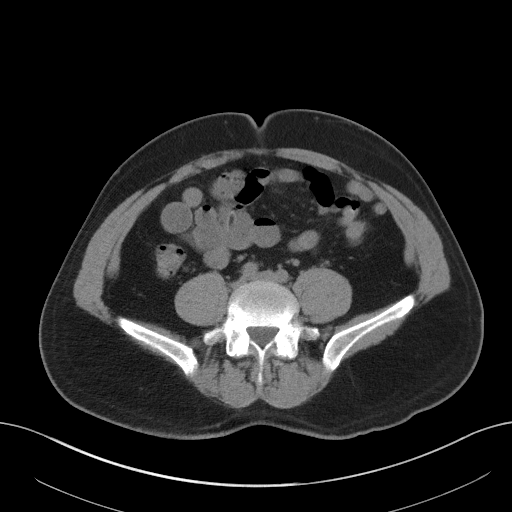
[im 54/107  soft-tissue]
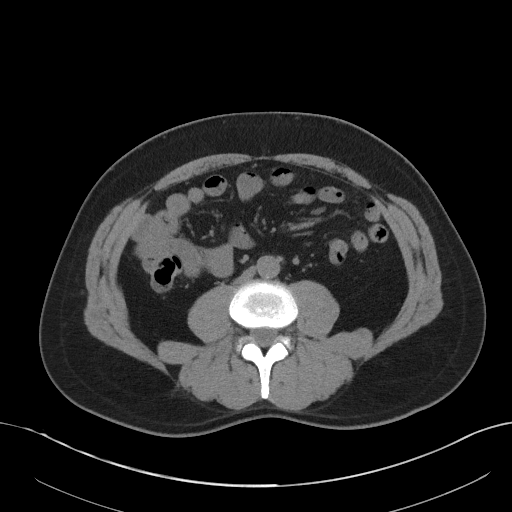
[im 62/107  soft-tissue]
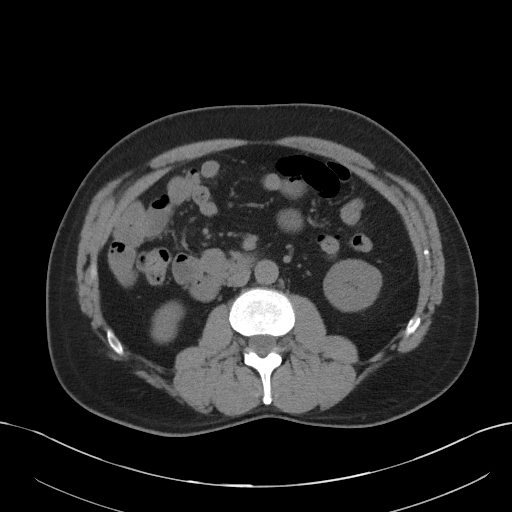
[im 70/107  soft-tissue]
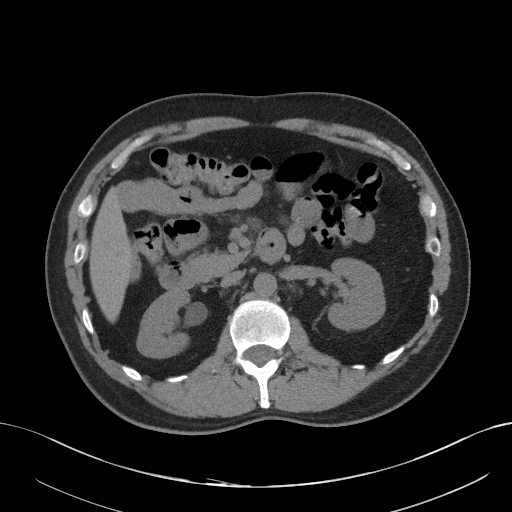
[im 70/107  bone]
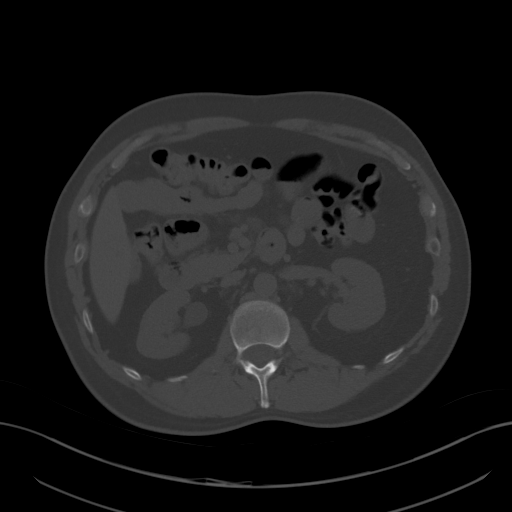
[im 78/107  soft-tissue]
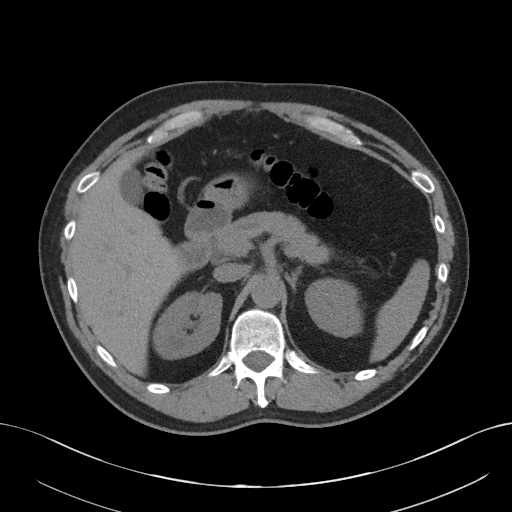
[im 86/107  soft-tissue]
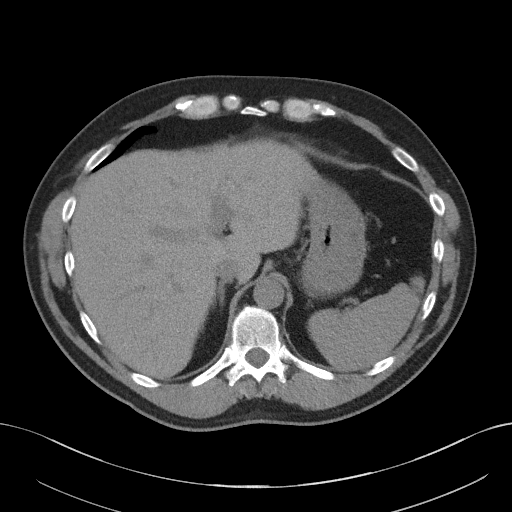
[im 94/107  soft-tissue]
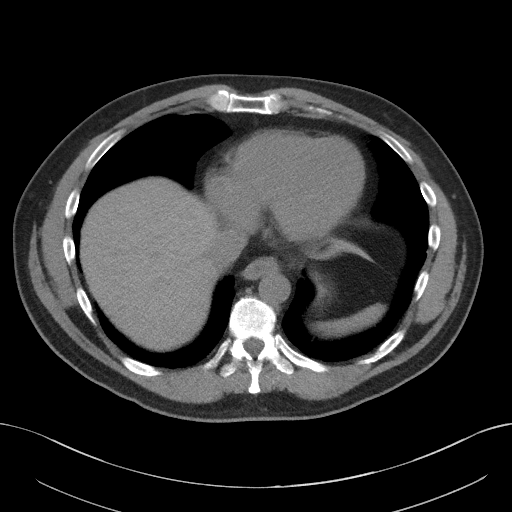
[im 102/107  soft-tissue]
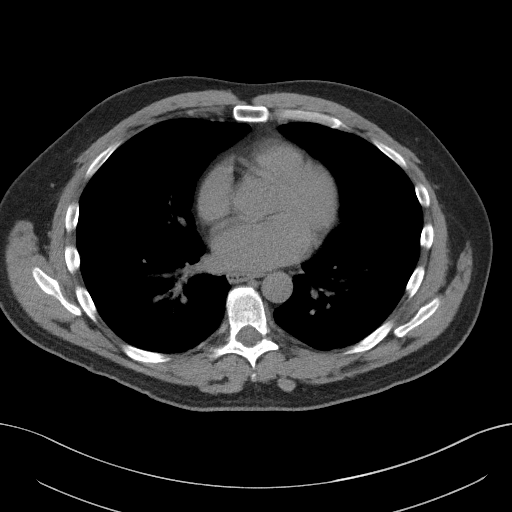

[Series 4: coronal st · coronal · 0.94mm/px · 3 of 82 slices shown]
[im 28/82  soft-tissue]
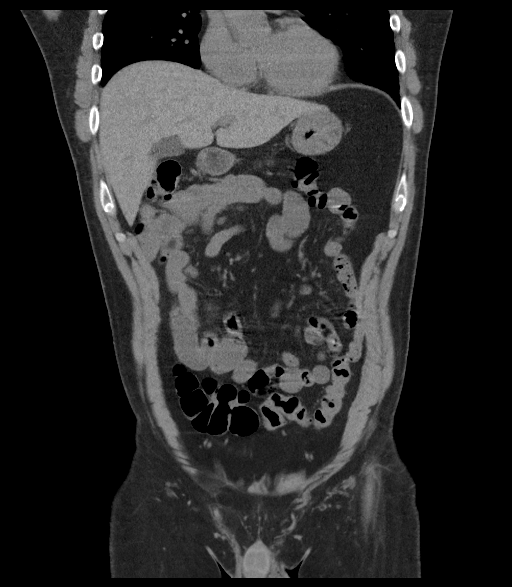
[im 37/82  soft-tissue]
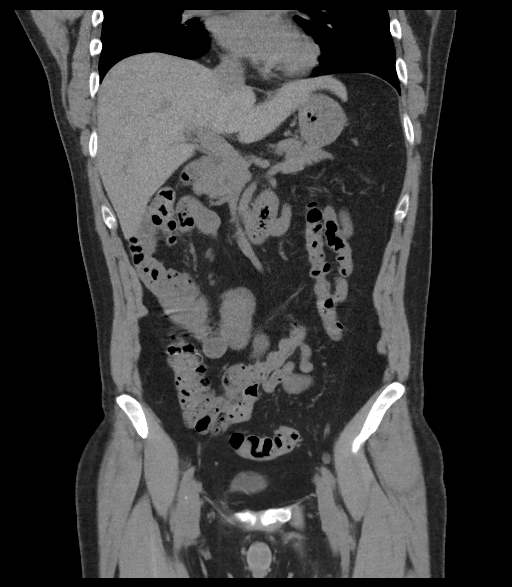
[im 46/82  soft-tissue]
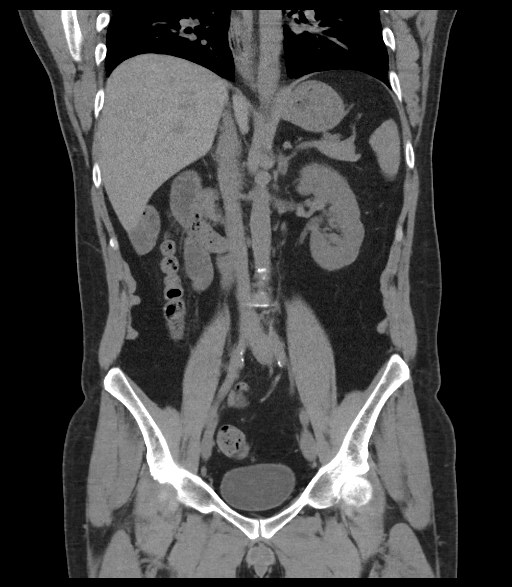

[16 of 46 positions shown; findings below may reference images not displayed]

FINDINGS: Lower chest: Dependent atelectasis in the lung bases. Coronary
artery calcifications.

Hepatobiliary: No focal liver abnormality is seen. No gallstones,
gallbladder wall thickening, or biliary dilatation.

Pancreas: Unremarkable. No pancreatic ductal dilatation or
surrounding inflammatory changes.

Spleen: Normal in size without focal abnormality.

Adrenals/Urinary Tract: No adrenal gland nodules. 2 mm stone in the
lower pole left kidney. 3 mm stone in the upper pole right kidney.
No hydronephrosis or hydroureter. No ureteral or bladder stones are
seen. Bladder wall is not thickened.

Stomach/Bowel: Stomach, small bowel, and colon are not abnormally
distended. No wall thickening is appreciated although under
distention limits evaluation of bowel wall. Scattered diverticula in
the sigmoid colon without evidence of diverticulitis. The appendix
is normal.

Vascular/Lymphatic: Scattered calcification in the abdominal aorta.
No aneurysm. No significant lymphadenopathy.

Reproductive: Prostate gland is mildly enlarged, measuring 4.3 cm
diameter.

Other: No free air or free fluid in the abdomen. Small right
inguinal hernia containing fat. No bowel herniation.

Musculoskeletal: No acute or significant osseous findings.
IMPRESSION: Bilateral nonobstructing intrarenal stones. No ureteral stone or
obstruction demonstrated.

## 2019-07-18 NOTE — Progress Notes (Signed)
 Cardiology Office Note   Date:  07/19/2019   ID:  Russell Gregory, DOB 06/04/1970, MRN [id number]  PCP:  Wong, Francis P, MD  Cardiologist:  Dr. Nelson    Chief Complaint  Patient presents with  . Shortness of Breath    and abnromal nuc study       History of Present Illness: Russell Gregory is a 48 y.o. male who presents for abnormal cardiac CTA  Last seen 06/07/19 - Pt with strong family history of premature CAD, HLP, hypertension who is coming after years.  He was seen in 2016 for chest pain and underwent exercise treadmill stress test that was negative for ischemia.  He continues to have episodes of what he calls indigestion, he walks a lot at work and does not have chest pain with that but occasionally gets short of breath when he gets emotionally stressed.  He has very significant family history of premature coronary artery disease in multiple cousins, and most recently his 42-year-old brother died in October 2020 of myocardial infarction.  He has been feeling more short of breath and lightheaded ever since he got the Covid vaccine in February.  He had coronary CTA   Lisinopril increased and BB added.   The cardiac CTA with CAD mod stenosis in mRCA and pLAD  FFR with significant stenosis in pLAD cardiac cath recommended.   Today occ chest pain.  But mild, worried about disease due to FH. We discussed Cardiac cath and risks, procedure and questions answered.  He does lift a lot with his job.  Also discussed possible outcomes of cath.  He is on statin but LDL is 126 he has had bone muscle pain with statins in past. Now on zocor 10 mg - will wait on cath to decide statin dose we did discuss lipid clinic.   Past Medical History:  Diagnosis Date  . Chest pain at rest 05/05/2014  . Dysuria-frequency syndrome   . GERD (gastroesophageal reflux disease)   . HLD (hyperlipidemia) 07/23/2012  . Hyperlipidemia   . Hypertension   . Inguinal hernia   . Insomnia   . Kidney calculi    . Left hip pain 05/19/2017  . Right ankle injury, subsequent encounter 01/28/2017  . Right ureteral stone     Past Surgical History:  Procedure Laterality Date  . CYSTOSCOPY WITH RETROGRADE PYELOGRAM, URETEROSCOPY AND STENT PLACEMENT Right 12/06/2013   Procedure: CYSTOSCOPY WITH RETROGRADE PYELOGRAM, URETEROSCOPY AND STONE BASKETRY;  Surgeon: John J Wrenn, MD;  Location: [facility];  Service: Urology;  Laterality: Right;  . HERNIA REPAIR     left inguinal repair  . TONSILLECTOMY       Current Outpatient Medications  Medication Sig Dispense Refill  . aspirin EC 81 MG tablet Take 1 tablet (81 mg total) by mouth daily. 90 tablet 3  . carvedilol (COREG) 6.25 MG tablet Take 1 tablet (6.25 mg total) by mouth 2 (two) times daily. 180 tablet 2  . esomeprazole (NEXIUM) 20 MG capsule Take 20 mg by mouth as needed.    . etodolac (LODINE) 500 MG tablet Take 500 mg by mouth 2 (two) times daily as needed.    . lisinopril (ZESTRIL) 40 MG tablet Take 1 tablet (40 mg total) by mouth daily. 90 tablet 2  . simvastatin (ZOCOR) 10 MG tablet SMARTSIG:1 Tablet(s) By Mouth Every Evening    . zolpidem (AMBIEN) 10 MG tablet TAKE 1 TABLET BY MOUTH AT BEDTIME 30 tablet 3   No   current facility-administered medications for this visit.    Allergies:   Hydrocodone and Morphine and related    Social History:  The patient  reports that he has never smoked. He has never used smokeless tobacco. He reports that he does not drink alcohol or use drugs.   Family History:  The patient's family history includes Heart attack in his paternal grandfather.    ROS:  General:no colds or fevers, no weight changes Skin:no rashes or ulcers HEENT:no blurred vision, no congestion CV:see HPI PUL:see HPI GI:no diarrhea constipation or melena, no indigestion GU:no hematuria, no dysuria MS:no joint pain, no claudication Neuro:no syncope, no lightheadedness Endo:no diabetes, no thyroid disease  Wt Readings from  Last 3 Encounters:  07/19/19 190 lb 12.8 oz (86.5 kg)  06/07/19 192 lb 12.8 oz (87.5 kg)  10/09/17 190 lb (86.2 kg)     PHYSICAL EXAM: VS:  BP 118/60   Pulse (!) 59   Ht 6' (1.829 m)   Wt 190 lb 12.8 oz (86.5 kg)   SpO2 97%   BMI 25.88 kg/m  , BMI Body mass index is 25.88 kg/m. General:Pleasant affect, NAD Skin:Warm and dry, brisk capillary refill HEENT:normocephalic, sclera clear, mucus membranes moist Neck:supple, no JVD, no bruits  Heart:S1S2 RRR without murmur, gallup, rub or click Lungs:clear without rales, rhonchi, or wheezes Abd:soft, non tender, + BS, do not palpate liver spleen or masses Ext:no lower ext edema, 2+ pedal pulses, 2+ radial pulses Neuro:alert and oriented X 3, MAE, follows commands, + facial symmetry    EKG:  EKG is ordered today. The ekg ordered today demonstrates SB at 59 no ST changes normal EKG    Recent Labs: No results found for requested labs within last 8760 hours.    Lipid Panel    Component Value Date/Time   CHOL 207 (H) 06/30/2013 0902   CHOL 151 07/23/2012 1059   TRIG 129 06/30/2013 0902   TRIG 114 10/29/2012 1048   TRIG 126 07/23/2012 1059   HDL 39 (L) 06/30/2013 0902   HDL 44 10/29/2012 1048   HDL 40 07/23/2012 1059   CHOLHDL 5.3 (H) 06/30/2013 0902   LDLCALC 142 (H) 06/30/2013 0902   LDLCALC 102 (H) 10/29/2012 1048   LDLCALC 86 07/23/2012 1059       Other studies Reviewed: Additional studies/ records that were reviewed today include: . Cardiac CTA and FFR Aorta:  Normal size.  No calcifications.  No dissection.  Aortic Valve:  Trileaflet.  No calcifications.  Coronary Arteries:  Normal coronary origin.  Right dominance.  RCA is a large dominant artery that gives rise to PDA and PLA. There is mild calcified plaque in the proximal RCA with stenosis 25-49%. Mid RCA has moderate non-calcified plaque with stenosis 50-69%. Distal RCA/PLA/PDA have no significant plaque.  Left main is a large artery that gives rise  to LAD, two small ramuses and LCX arteries. Left main has no plaque.  RI 1,2 are very small branches (2 mm in diameter) with mild calcified plaque.  LAD is a large vessel that gives rise to one diagonal artery. There is mild to moderate mixed, predominantly calcified plaque in the proximal LAD with stenosis 25-49% but possibly 50-69%. Mid and distal LAD has no significant plaque.  D1 is a small branch with no significant plaque.  LCX is a very small non-dominant artery that has no plaque.  Other findings:  Normal pulmonary vein drainage into the left atrium.  Normal left atrial appendage without a thrombus.  Normal   size of the pulmonary artery.  IMPRESSION: 1. Coronary calcium score of 107. This was 91 percentile for age and sex matched control.  2. Normal coronary origin with right dominance.  3. CAD-RADS 3. Moderate stenosis in the mid RCA and possibly in proximal LAD. Consider symptom-guided anti-ischemic pharmacotherapy as well as risk factor modification per guideline directed care. Additional analysis with CT FFR will be submitted.  1. Left Main: 0.95.  2. LAD: Proximal: 0.94, mid: 0.77, distal: 0.76. 3. RI: 0.94. 4. LCX: 0.93. 5. RCA: Proximal: 0.93, 0.86.  IMPRESSION: 1. CT FFR analysis showed significant stenosis in the proximal LAD. A cardiac catheterization is recommended.  ASSESSMENT AND PLAN:  1.  Chest pain with abnormal cardiac CTA and premature FH of CAD with brother dying of massive MI at 42. On ASA 81 and statin.  BP is well controlled with recent additions of ACE and BB. Follow up 2 weeks post cath   The patient understands that risks included but are not limited to stroke (1 in 1000), death (1 in 1000), kidney failure [usually temporary] (1 in 500), bleeding (1 in 200), allergic reaction [possibly serious] (1 in 200).   2.  HTN improved control on current meds. Continue   3. HLD will adjust post procedure. Has had problems with  statins in past causing pain in bones and muscles.  On zocor 20 mg po now.    4.  GERD on nexium   5. Premature family hx of CAD   Current medicines are reviewed with the patient today.  The patient Has no concerns regarding medicines.  The following changes have been made:  See above Labs/ tests ordered today include:see above  Disposition:   FU:  see above  Signed, Severina Sykora, NP  07/19/2019 9:45 AM    [facility] [address], [place], [place]  [address]/ [address] ,  Phone: [phone]; Fax: [phone]  [phone] 

## 2019-07-18 NOTE — H&P (View-Only) (Signed)
Cardiology Office Note   Date:  07/19/2019   ID:  Russell Gregory, DOB 12-06-1970, MRN [id number]  PCP:  Ileana Ladd, MD  Cardiologist:  Dr. Delton See    Chief Complaint  Patient presents with  . Shortness of Breath    and abnromal nuc study       History of Present Illness: Russell Gregory is a 49 y.o. male who presents for abnormal cardiac CTA  Last seen 06/07/19 - Pt with strong family history of premature CAD, HLP, hypertension who is coming after years.  He was seen in 2016 for chest pain and underwent exercise treadmill stress test that was negative for ischemia.  He continues to have episodes of what he calls indigestion, he walks a lot at work and does not have chest pain with that but occasionally gets short of breath when he gets emotionally stressed.  He has very significant family history of premature coronary artery disease in multiple cousins, and most recently his 36 year old brother died in 11-07-2020of myocardial infarction.  He has been feeling more short of breath and lightheaded ever since he got the Covid vaccine in February.  He had coronary CTA   Lisinopril increased and BB added.   The cardiac CTA with CAD mod stenosis in mRCA and pLAD  FFR with significant stenosis in pLAD cardiac cath recommended.   Today occ chest pain.  But mild, worried about disease due to FH. We discussed Cardiac cath and risks, procedure and questions answered.  He does lift a lot with his job.  Also discussed possible outcomes of cath.  He is on statin but LDL is 126 he has had bone muscle pain with statins in past. Now on zocor 10 mg - will wait on cath to decide statin dose we did discuss lipid clinic.   Past Medical History:  Diagnosis Date  . Chest pain at rest 05/05/2014  . Dysuria-frequency syndrome   . GERD (gastroesophageal reflux disease)   . HLD (hyperlipidemia) 07/23/2012  . Hyperlipidemia   . Hypertension   . Inguinal hernia   . Insomnia   . Kidney calculi    . Left hip pain 05/19/2017  . Right ankle injury, subsequent encounter 01/28/2017  . Right ureteral stone     Past Surgical History:  Procedure Laterality Date  . CYSTOSCOPY WITH RETROGRADE PYELOGRAM, URETEROSCOPY AND STENT PLACEMENT Right 12/06/2013   Procedure: CYSTOSCOPY WITH RETROGRADE PYELOGRAM, URETEROSCOPY AND STONE BASKETRY;  Surgeon: Anner Crete, MD;  Location: [facility];  Service: Urology;  Laterality: Right;  . HERNIA REPAIR     left inguinal repair  . TONSILLECTOMY       Current Outpatient Medications  Medication Sig Dispense Refill  . aspirin EC 81 MG tablet Take 1 tablet (81 mg total) by mouth daily. 90 tablet 3  . carvedilol (COREG) 6.25 MG tablet Take 1 tablet (6.25 mg total) by mouth 2 (two) times daily. 180 tablet 2  . esomeprazole (NEXIUM) 20 MG capsule Take 20 mg by mouth as needed.    . etodolac (LODINE) 500 MG tablet Take 500 mg by mouth 2 (two) times daily as needed.    [name] lisinopril (ZESTRIL) 40 MG tablet Take 1 tablet (40 mg total) by mouth daily. 90 tablet 2  . simvastatin (ZOCOR) 10 MG tablet SMARTSIG:1 Tablet(s) By Mouth Every Evening    . zolpidem (AMBIEN) 10 MG tablet TAKE 1 TABLET BY MOUTH AT BEDTIME 30 tablet 3   No  current facility-administered medications for this visit.    Allergies:   Hydrocodone and Morphine and related    Social History:  The patient  reports that he has never smoked. He has never used smokeless tobacco. He reports that he does not drink alcohol or use drugs.   Family History:  The patient's family history includes Heart attack in his paternal grandfather.    ROS:  General:no colds or fevers, no weight changes Skin:no rashes or ulcers HEENT:no blurred vision, no congestion CV:see HPI PUL:see HPI GI:no diarrhea constipation or melena, no indigestion GU:no hematuria, no dysuria MS:no joint pain, no claudication Neuro:no syncope, no lightheadedness Endo:no diabetes, no thyroid disease  Wt Readings from  Last 3 Encounters:  07/19/19 190 lb 12.8 oz (86.5 kg)  06/07/19 192 lb 12.8 oz (87.5 kg)  10/09/17 190 lb (86.2 kg)     PHYSICAL EXAM: VS:  BP 118/60   Pulse (!) 59   Ht 6' (1.829 m)   Wt 190 lb 12.8 oz (86.5 kg)   SpO2 97%   BMI 25.88 kg/m  , BMI Body mass index is 25.88 kg/m. General:Pleasant affect, NAD Skin:Warm and dry, brisk capillary refill HEENT:normocephalic, sclera clear, mucus membranes moist Neck:supple, no JVD, no bruits  Heart:S1S2 RRR without murmur, gallup, rub or click Lungs:clear without rales, rhonchi, or wheezes UMP:NTIR, non tender, + BS, do not palpate liver spleen or masses Ext:no lower ext edema, 2+ pedal pulses, 2+ radial pulses Neuro:alert and oriented X 3, MAE, follows commands, + facial symmetry    EKG:  EKG is ordered today. The ekg ordered today demonstrates SB at 41 no ST changes normal EKG    Recent Labs: No results found for requested labs within last 8760 hours.    Lipid Panel    Component Value Date/Time   CHOL 207 (H) 06/30/2013 0902   CHOL 151 07/23/2012 1059   TRIG 129 06/30/2013 0902   TRIG 114 10/29/2012 1048   TRIG 126 07/23/2012 1059   HDL 39 (L) 06/30/2013 0902   HDL 44 10/29/2012 1048   HDL 40 07/23/2012 1059   CHOLHDL 5.3 (H) 06/30/2013 0902   LDLCALC 142 (H) 06/30/2013 0902   LDLCALC 102 (H) 10/29/2012 1048   LDLCALC 86 07/23/2012 1059       Other studies Reviewed: Additional studies/ records that were reviewed today include: . Cardiac CTA and FFR Aorta:  Normal size.  No calcifications.  No dissection.  Aortic Valve:  Trileaflet.  No calcifications.  Coronary Arteries:  Normal coronary origin.  Right dominance.  RCA is a large dominant artery that gives rise to PDA and PLA. There is mild calcified plaque in the proximal RCA with stenosis 25-49%. Mid RCA has moderate non-calcified plaque with stenosis 50-69%. Distal RCA/PLA/PDA have no significant plaque.  Left main is a large artery that gives rise  to LAD, two small ramuses and LCX arteries. Left main has no plaque.  RI 1,2 are very small branches (2 mm in diameter) with mild calcified plaque.  LAD is a large vessel that gives rise to one diagonal artery. There is mild to moderate mixed, predominantly calcified plaque in the proximal LAD with stenosis 25-49% but possibly 50-69%. Mid and distal LAD has no significant plaque.  D1 is a small branch with no significant plaque.  LCX is a very small non-dominant artery that has no plaque.  Other findings:  Normal pulmonary vein drainage into the left atrium.  Normal left atrial appendage without a thrombus.  Normal  size of the pulmonary artery.  IMPRESSION: 1. Coronary calcium score of 107. This was 50 percentile for age and sex matched control.  2. Normal coronary origin with right dominance.  3. CAD-RADS 3. Moderate stenosis in the mid RCA and possibly in proximal LAD. Consider symptom-guided anti-ischemic pharmacotherapy as well as risk factor modification per guideline directed care. Additional analysis with CT FFR will be submitted.  1. Left Main: 0.95.  2. LAD: Proximal: 0.94, mid: 0.77, distal: 0.76. 3. RI: 0.94. 4. LCX: 0.93. 5. RCA: Proximal: 0.93, 0.86.  IMPRESSION: 1. CT FFR analysis showed significant stenosis in the proximal LAD. A cardiac catheterization is recommended.  ASSESSMENT AND PLAN:  1.  Chest pain with abnormal cardiac CTA and premature FH of CAD with brother dying of massive MI at 56. On ASA 81 and statin.  BP is well controlled with recent additions of ACE and BB. Follow up 2 weeks post cath   The patient understands that risks included but are not limited to stroke (1 in 1000), death (1 in 1000), kidney failure [usually temporary] (1 in 500), bleeding (1 in 200), allergic reaction [possibly serious] (1 in 200).   2.  HTN improved control on current meds. Continue   3. HLD will adjust post procedure. Has had problems with  statins in past causing pain in bones and muscles.  On zocor 20 mg po now.    4.  GERD on nexium   5. Premature family hx of CAD   Current medicines are reviewed with the patient today.  The patient Has no concerns regarding medicines.  The following changes have been made:  See above Labs/ tests ordered today include:see above  Disposition:   FU:  see above  Signed, Nada Boozer, NP  07/19/2019 9:45 AM    [facility] [address] [place], [place], [place]  [address]/ [address] [place], [place] Phone: [phone]; Fax: [phone]  [phone]

## 2019-07-19 ENCOUNTER — Ambulatory Visit: Payer: BC Managed Care – PPO | Admitting: Cardiology

## 2019-07-19 ENCOUNTER — Other Ambulatory Visit: Payer: Self-pay

## 2019-07-19 ENCOUNTER — Other Ambulatory Visit (HOSPITAL_COMMUNITY)
Admission: RE | Admit: 2019-07-19 | Discharge: 2019-07-19 | Disposition: A | Discharge status: Home / Self Care | Payer: BC Managed Care – PPO | Source: Ambulatory Visit | Attending: Interventional Cardiology | Admitting: Interventional Cardiology

## 2019-07-19 ENCOUNTER — Encounter: Payer: Self-pay | Admitting: Cardiology

## 2019-07-19 VITALS — BP 118/60 | HR 59 | Ht 72.0 in | Wt 190.8 lb

## 2019-07-19 DIAGNOSIS — E782 Mixed hyperlipidemia: Secondary | ICD-10-CM | POA: Diagnosis not present

## 2019-07-19 DIAGNOSIS — I10 Essential (primary) hypertension: Secondary | ICD-10-CM | POA: Diagnosis not present

## 2019-07-19 DIAGNOSIS — R9389 Abnormal findings on diagnostic imaging of other specified body structures: Secondary | ICD-10-CM

## 2019-07-19 DIAGNOSIS — Z01812 Encounter for preprocedural laboratory examination: Secondary | ICD-10-CM | POA: Diagnosis not present

## 2019-07-19 DIAGNOSIS — Z20822 Contact with and (suspected) exposure to covid-19: Secondary | ICD-10-CM | POA: Insufficient documentation

## 2019-07-19 DIAGNOSIS — R079 Chest pain, unspecified: Secondary | ICD-10-CM | POA: Diagnosis not present

## 2019-07-19 DIAGNOSIS — Z8249 Family history of ischemic heart disease and other diseases of the circulatory system: Secondary | ICD-10-CM

## 2019-07-19 LAB — CBC
Hematocrit: 41 % (ref 37.5–51.0)
Hemoglobin: 13.9 g/dL (ref 13.0–17.7)
MCH: 32.5 pg (ref 26.6–33.0)
MCHC: 33.9 g/dL (ref 31.5–35.7)
MCV: 96 fL (ref 79–97)
Platelets: 251 10*3/uL (ref 150–450)
RBC: 4.28 x10E6/uL (ref 4.14–5.80)
RDW: 12.9 % (ref 11.6–15.4)
WBC: 5.8 10*3/uL (ref 3.4–10.8)

## 2019-07-19 LAB — BASIC METABOLIC PANEL
BUN/Creatinine Ratio: 11 (ref 9–20)
BUN: 10 mg/dL (ref 6–24)
CO2: 24 mmol/L (ref 20–29)
Calcium: 9.9 mg/dL (ref 8.7–10.2)
Chloride: 105 mmol/L (ref 96–106)
Creatinine, Ser: 0.89 mg/dL (ref 0.76–1.27)
GFR calc Af Amer: 116 mL/min/{1.73_m2} (ref >59)
GFR calc non Af Amer: 100 mL/min/{1.73_m2} (ref >59)
Glucose: 85 mg/dL (ref 65–99)
Potassium: 4.2 mmol/L (ref 3.5–5.2)
Sodium: 138 mmol/L (ref 134–144)

## 2019-07-19 LAB — SARS CORONAVIRUS 2 (TAT 6-24 HRS): SARS Coronavirus 2: NEGATIVE

## 2019-07-19 NOTE — Patient Instructions (Addendum)
Medication Instructions:  Your physician recommends that you continue on your current medications as directed. Please refer to the Current Medication list given to you today.  *If you need a refill on your cardiac medications before your next appointment, please call your pharmacy*   Lab Work: BMET and CBC today  If you have labs (blood work) drawn today and your tests are completely normal, you will receive your results only by: [name] MyChart Message (if you have MyChart) OR . A paper copy in the mail If you have any lab test that is abnormal or we need to change your treatment, we will call you to review the results.   Testing/Procedures: Your physician has requested that you have a cardiac catheterization. Cardiac catheterization is used to diagnose and/or treat various heart conditions. Doctors may recommend this procedure for a number of different reasons. The most common reason is to evaluate chest pain. Chest pain can be a symptom of coronary artery disease (CAD), and cardiac catheterization can show whether plaque is narrowing or blocking your heart's arteries. This procedure is also used to evaluate the valves, as well as measure the blood flow and oxygen levels in different parts of your heart. For further information please visit [email or web address]. Please follow instruction sheet, as given.    Follow-Up: At [facility], you and your health needs are our priority.  As part of our continuing mission to provide you with exceptional heart care, we have created designated Provider Care Teams.  These Care Teams include your primary Cardiologist (physician) and Advanced Practice Providers (APPs -  Physician Assistants and Nurse Practitioners) who all work together to provide you with the care you need, when you need it.  We recommend signing up for the patient portal called "MyChart".  Sign up information is provided on this After Visit Summary.  MyChart is used to connect with patients  for Virtual Visits (Telemedicine).  Patients are able to view lab/test results, encounter notes, upcoming appointments, etc.  Non-urgent messages can be sent to your provider as well.   To learn more about what you can do with MyChart, go to [email or web address].    Your next appointment:   2-3 week(s) after cath  The format for your next appointment:   In Person  Provider:   You may see Dr. Ena Dawley or one of the following Advanced Practice Providers on your designated Care Team:    Melina Copa, PA-C  Ermalinda Barrios, PA-C    Other Instructions  COVID SCREENING INFORMATION: You are scheduled for your drive-thru COVID screening on 07/19/19 at 10:40AM. [facility]) [address] Stay in the RIGHT lane and proceed under the brick awning and tell them you are there for pre-procedure testing. Do NOT bring any pets with you to the testing site. You will need to go home after your screening and quarantine until your procedure.    [place] OFFICE [place], [place] [address] Dept: [phone] Loc: [place]  07/19/2019  You are scheduled for a Cardiac Catheterization on Thursday, May 27 with Dr. Larae Grooms.  1. Please arrive at the [facility] (Main Entrance A) at [facility]: [address] [place], [place] [address] at 9:00 AM (This time is two hours before your procedure to ensure your preparation). Free valet parking service is available.   Special note: Every effort is made  to have your procedure done on time. Please understand that emergencies sometimes delay scheduled procedures.  2. Diet: Do not eat solid foods after midnight.  The patient may have clear liquids until 5am upon the day of the procedure.  3. Labs: Your labs will be drawn today.  4. Medication instructions in preparation  for your procedure:   Contrast Allergy: No   On the morning of your procedure, take your Aspirin and any morning medicines NOT listed above.  You may use sips of water.  5. Plan for one night stay--bring personal belongings. 6. Bring a current list of your medications and current insurance cards. 7. You MUST have a responsible person to drive you home. 8. Someone MUST be with you the first 24 hours after you arrive home or your discharge will be delayed. 9. Please wear clothes that are easy to get on and off and wear slip-on shoes.  Thank you for allowing [place] to care for you!   -- [facility]

## 2019-07-20 ENCOUNTER — Telehealth: Payer: Self-pay | Admitting: Cardiology

## 2019-07-20 ENCOUNTER — Telehealth: Payer: Self-pay | Admitting: *Deleted

## 2019-07-20 NOTE — Telephone Encounter (Signed)
Accessed pt's chart to see who called him today. Reached out to [place] via secure chat and then transferred the call.

## 2019-07-20 NOTE — Telephone Encounter (Addendum)
Pt contacted pre-catheterization scheduled at [facility] for: Thursday Jul 21, 2019 11 AM Verified arrival time and place: [facility] Main Entrance A [facility]) at: 9 AM   No solid food after midnight prior to cath, clear liquids until 5 AM day of procedure.  AM meds can be  taken pre-cath with sip of water including: ASA 81 mg   Confirmed patient has responsible adult to drive home post procedure and observe 24 hours after arriving home: yes  You are allowed ONE visitor in the waiting room during your procedure. Both you and your visitor must wear masks.      COVID-19 Pre-Screening Questions:  . In the past 7 to 10 days have you had a cough,  shortness of breath, headache, congestion, fever (100 or greater) body aches, chills, sore throat, or sudden loss of taste or sense of smell? no . Have you been around anyone with known Covid 19 in the past 7 to 10 days? no . Have you been around anyone who is awaiting Covid 19 test results in the past 7 to 10 days? no . Have you been around anyone who has mentioned symptoms of Covid 19 within the past 7 to 10 days? No  Reviewed procedure/mask/visitor instructions, COVID-19 screening questions with patient.

## 2019-07-21 ENCOUNTER — Encounter (HOSPITAL_COMMUNITY): Payer: Self-pay | Admitting: Interventional Cardiology

## 2019-07-21 ENCOUNTER — Ambulatory Visit (HOSPITAL_COMMUNITY)
Admission: RE | Disposition: A | Discharge status: Home / Self Care | Payer: Self-pay | Source: Home / Self Care | Attending: Interventional Cardiology

## 2019-07-21 ENCOUNTER — Ambulatory Visit (HOSPITAL_COMMUNITY)
Admission: RE | Admit: 2019-07-21 | Discharge: 2019-07-21 | Disposition: A | Discharge status: Home / Self Care | Payer: BC Managed Care – PPO | Attending: Interventional Cardiology | Admitting: Interventional Cardiology

## 2019-07-21 ENCOUNTER — Other Ambulatory Visit: Payer: Self-pay

## 2019-07-21 DIAGNOSIS — Z79899 Other long term (current) drug therapy: Secondary | ICD-10-CM | POA: Insufficient documentation

## 2019-07-21 DIAGNOSIS — Z955 Presence of coronary angioplasty implant and graft: Secondary | ICD-10-CM | POA: Diagnosis not present

## 2019-07-21 DIAGNOSIS — I25119 Atherosclerotic heart disease of native coronary artery with unspecified angina pectoris: Secondary | ICD-10-CM | POA: Insufficient documentation

## 2019-07-21 DIAGNOSIS — Z7982 Long term (current) use of aspirin: Secondary | ICD-10-CM | POA: Insufficient documentation

## 2019-07-21 DIAGNOSIS — I2510 Atherosclerotic heart disease of native coronary artery without angina pectoris: Secondary | ICD-10-CM | POA: Diagnosis present

## 2019-07-21 DIAGNOSIS — I10 Essential (primary) hypertension: Secondary | ICD-10-CM | POA: Diagnosis not present

## 2019-07-21 DIAGNOSIS — E785 Hyperlipidemia, unspecified: Secondary | ICD-10-CM | POA: Diagnosis not present

## 2019-07-21 DIAGNOSIS — Z885 Allergy status to narcotic agent status: Secondary | ICD-10-CM | POA: Insufficient documentation

## 2019-07-21 DIAGNOSIS — I25118 Atherosclerotic heart disease of native coronary artery with other forms of angina pectoris: Secondary | ICD-10-CM

## 2019-07-21 DIAGNOSIS — Z8249 Family history of ischemic heart disease and other diseases of the circulatory system: Secondary | ICD-10-CM | POA: Insufficient documentation

## 2019-07-21 DIAGNOSIS — Z95820 Peripheral vascular angioplasty status with implants and grafts: Secondary | ICD-10-CM

## 2019-07-21 HISTORY — DX: Atherosclerotic heart disease of native coronary artery without angina pectoris: I25.10

## 2019-07-21 HISTORY — PX: CORONARY STENT INTERVENTION: CATH118234

## 2019-07-21 HISTORY — PX: LEFT HEART CATH AND CORONARY ANGIOGRAPHY: CATH118249

## 2019-07-21 HISTORY — PX: CORONARY ULTRASOUND/IVUS: CATH118244

## 2019-07-21 LAB — POCT ACTIVATED CLOTTING TIME
Activated Clotting Time: 263 seconds
Activated Clotting Time: 395 seconds

## 2019-07-21 SURGERY — LEFT HEART CATH AND CORONARY ANGIOGRAPHY
Anesthesia: LOCAL

## 2019-07-21 MED ORDER — CLOPIDOGREL BISULFATE 75 MG PO TABS
75.0000 mg | ORAL_TABLET | Freq: Every day | ORAL | 2 refills | Status: DC
Start: 2019-07-21 — End: 2020-02-07

## 2019-07-21 MED ORDER — VERAPAMIL HCL 2.5 MG/ML IV SOLN
INTRAVENOUS | Status: DC | PRN
  Administered 2019-07-21: 10 mL via INTRA_ARTERIAL

## 2019-07-21 MED ORDER — FENTANYL CITRATE (PF) 100 MCG/2ML IJ SOLN
INTRAMUSCULAR | Status: AC
  Filled 2019-07-21: qty 2

## 2019-07-21 MED ORDER — MIDAZOLAM HCL 2 MG/2ML IJ SOLN
INTRAMUSCULAR | Status: DC | PRN
  Administered 2019-07-21: 1 mg via INTRAVENOUS
  Administered 2019-07-21: 2 mg via INTRAVENOUS
  Administered 2019-07-21: 1 mg via INTRAVENOUS

## 2019-07-21 MED ORDER — ACETAMINOPHEN 325 MG PO TABS: 650.0000 mg | ORAL_TABLET | ORAL | Status: DC | PRN

## 2019-07-21 MED ORDER — LIDOCAINE HCL (PF) 1 % IJ SOLN
INTRAMUSCULAR | Status: DC | PRN
  Administered 2019-07-21: 2 mL

## 2019-07-21 MED ORDER — HEPARIN SODIUM (PORCINE) 1000 UNIT/ML IJ SOLN
INTRAMUSCULAR | Status: AC
  Filled 2019-07-21: qty 1

## 2019-07-21 MED ORDER — ASPIRIN 81 MG PO CHEW: 81.0000 mg | CHEWABLE_TABLET | ORAL | Status: DC

## 2019-07-21 MED ORDER — FENTANYL CITRATE (PF) 100 MCG/2ML IJ SOLN
INTRAMUSCULAR | Status: DC | PRN
  Administered 2019-07-21: 25 ug via INTRAVENOUS
  Administered 2019-07-21: 50 ug via INTRAVENOUS
  Administered 2019-07-21: 25 ug via INTRAVENOUS

## 2019-07-21 MED ORDER — PANTOPRAZOLE SODIUM 40 MG PO TBEC: 40.0000 mg | DELAYED_RELEASE_TABLET | Freq: Every day | ORAL | 11 refills | Status: DC

## 2019-07-21 MED ORDER — NITROGLYCERIN 0.4 MG SL SUBL: 0.4000 mg | SUBLINGUAL_TABLET | SUBLINGUAL | 2 refills | Status: AC | PRN

## 2019-07-21 MED ORDER — CLOPIDOGREL BISULFATE 300 MG PO TABS
ORAL_TABLET | ORAL | Status: AC
  Filled 2019-07-21: qty 2

## 2019-07-21 MED ORDER — CLOPIDOGREL BISULFATE 300 MG PO TABS
ORAL_TABLET | ORAL | Status: DC | PRN
  Administered 2019-07-21: 600 mg via ORAL

## 2019-07-21 MED ORDER — SODIUM CHLORIDE 0.9 % WEIGHT BASED INFUSION
3.0000 mL/kg/h | INTRAVENOUS | Status: AC
  Administered 2019-07-21: 3 mL/kg/h via INTRAVENOUS

## 2019-07-21 MED ORDER — SODIUM CHLORIDE 0.9 % IV SOLN: 250.0000 mL | INTRAVENOUS | Status: DC | PRN

## 2019-07-21 MED ORDER — SODIUM CHLORIDE 0.9% FLUSH: 3.0000 mL | Freq: Two times a day (BID) | INTRAVENOUS | Status: DC

## 2019-07-21 MED ORDER — SODIUM CHLORIDE 0.9 % WEIGHT BASED INFUSION
1.0000 mL/kg/h | INTRAVENOUS | Status: DC
  Administered 2019-07-21: 1 mL/kg/h via INTRAVENOUS

## 2019-07-21 MED ORDER — MIDAZOLAM HCL 2 MG/2ML IJ SOLN
INTRAMUSCULAR | Status: AC
  Filled 2019-07-21: qty 2

## 2019-07-21 MED ORDER — LABETALOL HCL 5 MG/ML IV SOLN: 10.0000 mg | INTRAVENOUS | Status: DC | PRN

## 2019-07-21 MED ORDER — VERAPAMIL HCL 2.5 MG/ML IV SOLN
INTRAVENOUS | Status: AC
  Filled 2019-07-21: qty 2

## 2019-07-21 MED ORDER — CLOPIDOGREL BISULFATE 75 MG PO TABS: 75.0000 mg | ORAL_TABLET | Freq: Every day | ORAL | Status: DC

## 2019-07-21 MED ORDER — IOHEXOL 350 MG/ML SOLN
INTRAVENOUS | Status: DC | PRN
  Administered 2019-07-21: 118 mL

## 2019-07-21 MED ORDER — SODIUM CHLORIDE 0.9% FLUSH: 3.0000 mL | INTRAVENOUS | Status: DC | PRN

## 2019-07-21 MED ORDER — ONDANSETRON HCL 4 MG/2ML IJ SOLN: 4.0000 mg | Freq: Four times a day (QID) | INTRAMUSCULAR | Status: DC | PRN

## 2019-07-21 MED ORDER — SODIUM CHLORIDE 0.9 % IV SOLN: INTRAVENOUS | Status: AC

## 2019-07-21 MED ORDER — NITROGLYCERIN 1 MG/10 ML FOR IR/CATH LAB
INTRA_ARTERIAL | Status: AC
  Filled 2019-07-21: qty 10

## 2019-07-21 MED ORDER — HEPARIN SODIUM (PORCINE) 1000 UNIT/ML IJ SOLN
INTRAMUSCULAR | Status: DC | PRN
  Administered 2019-07-21 (×2): 4500 [IU] via INTRAVENOUS
  Administered 2019-07-21: 4000 [IU] via INTRAVENOUS

## 2019-07-21 MED ORDER — LIDOCAINE HCL (PF) 1 % IJ SOLN
INTRAMUSCULAR | Status: AC
  Filled 2019-07-21: qty 30

## 2019-07-21 MED ORDER — HEPARIN (PORCINE) IN NACL 1000-0.9 UT/500ML-% IV SOLN
INTRAVENOUS | Status: AC
  Filled 2019-07-21: qty 1000

## 2019-07-21 MED ORDER — HYDRALAZINE HCL 20 MG/ML IJ SOLN: 10.0000 mg | INTRAMUSCULAR | Status: DC | PRN

## 2019-07-21 MED ORDER — ASPIRIN 81 MG PO CHEW: 81.0000 mg | CHEWABLE_TABLET | Freq: Every day | ORAL | Status: DC

## 2019-07-21 MED ORDER — HEPARIN (PORCINE) IN NACL 1000-0.9 UT/500ML-% IV SOLN
INTRAVENOUS | Status: DC | PRN
  Administered 2019-07-21 (×2): 500 mL

## 2019-07-21 MED FILL — PANTOPRAZOLE SOD DR 40 MG T: 40 | 30 days supply | Qty: 30 | Fill #0

## 2019-07-21 MED FILL — CLOPIDOGREL 75 MG TABLET: 75 | 30 days supply | Qty: 30 | Fill #0

## 2019-07-21 SURGICAL SUPPLY — 18 items
BALLN ~~LOC~~ EMERGE MR 4.0X8 (BALLOONS) ×2
BALLOON ~~LOC~~ EMERGE MR 4.0X8 (BALLOONS) IMPLANT
CATH 5FR JL3.5 JR4 ANG PIG MP (CATHETERS) ×1 IMPLANT
CATH LAUNCHER 6FR EBU3.5 (CATHETERS) ×1 IMPLANT
CATH OPTICROSS HD (CATHETERS) ×1 IMPLANT
DEVICE RAD COMP TR BAND LRG (VASCULAR PRODUCTS) ×1 IMPLANT
GLIDESHEATH SLEND SS 6F .021 (SHEATH) ×1 IMPLANT
GUIDEWIRE INQWIRE 1.5J.035X260 (WIRE) IMPLANT
INQWIRE 1.5J .035X260CM (WIRE) ×4
KIT ENCORE 26 ADVANTAGE (KITS) ×1 IMPLANT
KIT HEART LEFT (KITS) ×2 IMPLANT
PACK CARDIAC CATHETERIZATION (CUSTOM PROCEDURE TRAY) ×2 IMPLANT
SLED PULL BACK IVUS (MISCELLANEOUS) ×1 IMPLANT
STENT SYNERGY XD 4.0X16 (Permanent Stent) IMPLANT
SYNERGY XD 4.0X16 (Permanent Stent) ×2 IMPLANT
TRANSDUCER W/STOPCOCK (MISCELLANEOUS) ×2 IMPLANT
TUBING CIL FLEX 10 FLL-RA (TUBING) ×2 IMPLANT
WIRE ASAHI PROWATER 180CM (WIRE) ×1 IMPLANT

## 2019-07-21 NOTE — Progress Notes (Signed)
[id number] Education completed with pt who voiced understanding. Reviewed NTG use, heart healthy food choices, walking for ex, risk factors, importance of plavix with stent, and CRP 2. Referred to [place] CRP 2 but pt stated he will not be able to attend as he works a lot. Stated will exercise on his own. Luetta Nutting RN BSN 07/21/2019 3:20 PM

## 2019-07-21 NOTE — Discharge Instructions (Signed)
Coronary Artery Disease, Male Coronary artery disease (CAD) is a condition in which the arteries that lead to the heart (coronary arteries) become narrow or blocked. The narrowing or blockage can lead to decreased blood flow to the heart. Prolonged reduced blood flow can cause a heart attack (myocardial infarction or MI). This condition may also be called coronary heart disease. Because CAD is the leading cause of death in men, it is important to understand what causes this condition and how it is treated. What are the causes? CAD is most often caused by atherosclerosis. This is the buildup of fat and cholesterol (plaque) on the inside of the arteries. Over time, the plaque may narrow or block the artery, reducing blood flow to the heart. Plaque can also become weak and break off within a coronary artery and cause a sudden blockage. Other less common causes of CAD include:  A blood clot or a piece of a blood clot or other substance that blocks the flow of blood in a coronary artery (embolism).  A tearing of the artery (spontaneous coronary artery dissection).  An enlargement of an artery (aneurysm).  Inflammation (vasculitis) in the artery wall. What increases the risk? The following factors may make you more likely to develop this condition:  Age. Men over age 45 are at a greater risk of CAD.  Family history of CAD.  Gender. Men often develop CAD earlier in life than women.  High blood pressure (hypertension).  Diabetes.  High cholesterol levels.  Tobacco use.  Excessive alcohol use.  Lack of exercise.  A diet high in saturated and trans fats, such as fried food and processed meat. Other possible risk factors include:  High stress levels.  Depression.  Obesity.  Sleep apnea. What are the signs or symptoms? Many people do not have any symptoms during the early stages of CAD. As the condition progresses, symptoms may include:  Chest pain (angina). The pain can: ? Feel  like crushing or squeezing, or like a tightness, pressure, fullness, or heaviness in the chest. ? Last more than a few minutes or can stop and recur. The pain tends to get worse with exercise or stress and to fade with rest.  Pain in the arms, neck, jaw, ear, or back.  Unexplained heartburn or indigestion.  Shortness of breath.  Nausea or vomiting.  Sudden light-headedness.  Sudden cold sweats.  Fluttering or fast heartbeat (palpitations). How is this diagnosed? This condition is diagnosed based on:  Your family and medical history.  A physical exam.  Tests, including: ? A test to check the electrical signals in your heart (electrocardiogram). ? Exercise stress test. This looks for signs of blockage when the heart is stressed with exercise, such as running on a treadmill. ? Pharmacologic stress test. This test looks for signs of blockage when the heart is being stressed with a medicine. ? Blood tests. ? Coronary angiogram. This is a procedure to look at the coronary arteries to see if there is any blockage. During this test, a dye is injected into your arteries so they appear on an X-ray. ? Coronary artery CT scan. This CT scan helps detect calcium deposits in your coronary arteries. Calcium deposits are an indicator of CAD. ? A test that uses sound waves to take a picture of your heart (echocardiogram). ? Chest X-ray. How is this treated? This condition may be treated by:  Healthy lifestyle changes to reduce risk factors.  Medicines such as: ? Antiplatelet medicines and blood-thinning medicines, such   as aspirin. These help to prevent blood clots. ? Nitroglycerin. ? Blood pressure medicines. ? Cholesterol-lowering medicine.  Coronary angioplasty and stenting. During this procedure, a thin, flexible tube is inserted through a blood vessel and into a blocked artery. A balloon or similar device on the end of the tube is inflated to open up the artery. In some cases, a small,  mesh tube (stent) is inserted into the artery to keep it open.  Coronary artery bypass surgery. During this surgery, veins or arteries from other parts of the body are used to create a bypass around the blockage and allow blood to reach your heart. Follow these instructions at home: Medicines  Take over-the-counter and prescription medicines only as told by your health care provider.  Do not take the following medicines unless your health care provider approves: ? NSAIDs, such as ibuprofen, naproxen, or celecoxib. ? Vitamin supplements that contain vitamin A, vitamin E, or both. Lifestyle  Follow an exercise program approved by your health care provider. Aim for 150 minutes of moderate exercise or 75 minutes of vigorous exercise each week.  Maintain a healthy weight or lose weight as approved by your health care provider.  Learn to manage stress or try to limit your stress. Ask your health care provider for suggestions if you need help.  Get screened for depression and seek treatment, if needed.  Do not use any products that contain nicotine or tobacco, such as cigarettes, e-cigarettes, and chewing tobacco. If you need help quitting, ask your health care provider.  Do not use illegal drugs. Eating and drinking   Follow a heart-healthy diet. A dietitian can help educate you about healthy food options and changes. In general, eat plenty of fruits and vegetables, lean meats, and whole grains.  Avoid foods high in: ? Sugar. ? Salt (sodium). ? Saturated fat, such as processed or fatty meat. ? Trans fat, such as fried foods.  Use healthy cooking methods such as roasting, grilling, broiling, baking, poaching, steaming, or stir-frying.  Do not drink alcohol if your health care provider tells you not to drink.  If you drink alcohol: ? Limit how much you have to 0-2 drinks per day. ? Be aware of how much alcohol is in your drink. In the [place] one drink equals one 12 oz bottle of beer  (355 mL), one 5 oz glass of wine (148 mL), or one 1 oz glass of hard liquor (44 mL). General instructions  Manage any other health conditions, such as hypertension and diabetes. These conditions affect your heart.  Your health care provider may ask you to monitor your blood pressure. Ideally, your blood pressure should be below 130/80.  Keep all follow-up visits as told by your health care provider. This is important. Get help right away if:  You have pain in your chest, neck, ear, arm, jaw, stomach, or back that: ? Lasts more than a few minutes. ? Is recurring. ? Is not relieved by taking medicine under your tongue (sublingual nitroglycerin).  You have profuse sweating without cause.  You have unexplained: ? Heartburn or indigestion. ? Shortness of breath or difficulty breathing. ? Fluttering or fast heartbeat (palpitations). ? Nausea or vomiting. ? Fatigue. ? Feelings of nervousness or anxiety. ? Weakness. ? Diarrhea.  You have sudden light-headedness or dizziness.  You faint.  You feel like hurting yourself or think about taking your own life. These symptoms may represent a serious problem that is an emergency. Do not wait to see if the   symptoms will go away. Get medical help right away. Call your local emergency services (911 in the [place] Do not drive yourself to the hospital. Summary  Coronary artery disease (CAD) is a condition in which the arteries that lead to the heart (coronary arteries) become narrow or blocked. The narrowing or blockage can lead to a heart attack.  Many people do not have any symptoms during the early stages of CAD.  CAD can be treated with lifestyle changes, medicines, surgery, or a combination of these treatments. This information is not intended to replace advice given to you by your health care provider. Make sure you discuss any questions you have with your health care provider. Document Revised: 10/30/2017 Document Reviewed:  10/20/2017 Elsevier Patient Education  2020 [employer]   Radial Site Care  This sheet gives you information about how to care for yourself after your procedure. Your health care provider may also give you more specific instructions. If you have problems or questions, contact your health care provider. What can I expect after the procedure? After the procedure, it is common to have:  Bruising and tenderness at the catheter insertion area. Follow these instructions at home: Medicines  Take over-the-counter and prescription medicines only as told by your health care provider. Insertion site care  Follow instructions from your health care provider about how to take care of your insertion site. Make sure you: ? Wash your hands with soap and water before you change your bandage (dressing). If soap and water are not available, use hand sanitizer. ? Change your dressing as told by your health care provider.  Check your insertion site every day for signs of infection. Check for: ? Redness, swelling, or pain. ? Fluid or blood. ? Pus or a bad smell. ? Warmth.  Do not take baths, swim, or use a hot tub until your health care provider approves.  You may shower 24-48 hours after the procedure, or as directed by your health care provider. ? Remove the dressing and gently wash the site with plain soap and water. ? Pat the area dry with a clean towel. ? Do not rub the site. That could cause bleeding.  Do not apply powder or lotion to the site. Activity   For 24 hours after the procedure, or as directed by your health care provider: ? Do not flex or bend the affected arm. ? Do not push or pull heavy objects with the affected arm. ? Do not drive yourself home from the hospital or clinic. You may drive 24 hours after the procedure unless your health care provider tells you not to. ? Do not operate machinery or power tools.  Do not lift anything that is heavier than 10 lb (4.5 kg), or the  limit that you are told, until your health care provider says that it is safe.  Ask your health care provider when it is okay to: ? Return to work or school. ? Resume usual physical activities or sports. ? Resume sexual activity. General instructions  If the catheter site starts to bleed, raise your arm and put firm pressure on the site. If the bleeding does not stop, get help right away. This is a medical emergency.  If you went home on the same day as your procedure, a responsible adult should be with you for the first 24 hours after you arrive home.  Keep all follow-up visits as told by your health care provider. This is important. Contact a health care provider if:    You have a fever.  You have redness, swelling, or yellow drainage around your insertion site. Get help right away if:  You have unusual pain at the radial site.  The catheter insertion area swells very fast.  The insertion area is bleeding, and the bleeding does not stop when you hold steady pressure on the area.  Your arm or hand becomes pale, cool, tingly, or numb. These symptoms may represent a serious problem that is an emergency. Do not wait to see if the symptoms will go away. Get medical help right away. Call your local emergency services (911 in the [place] Do not drive yourself to the hospital. Summary  After the procedure, it is common to have bruising and tenderness at the site.  Follow instructions from your health care provider about how to take care of your radial site wound. Check the wound every day for signs of infection.  Do not lift anything that is heavier than 10 lb (4.5 kg), or the limit that you are told, until your health care provider says that it is safe. This information is not intended to replace advice given to you by your health care provider. Make sure you discuss any questions you have with your health care provider. Document Revised: 03/18/2017 Document Reviewed: 03/18/2017 Elsevier  Patient Education  2020 [employer]   Moderate Conscious Sedation, Adult, Care After These instructions provide you with information about caring for yourself after your procedure. Your health care provider may also give you more specific instructions. Your treatment has been planned according to current medical practices, but problems sometimes occur. Call your health care provider if you have any problems or questions after your procedure. What can I expect after the procedure? After your procedure, it is common:  To feel sleepy for several hours.  To feel clumsy and have poor balance for several hours.  To have poor judgment for several hours.  To vomit if you eat too soon. Follow these instructions at home: For at least 24 hours after the procedure:   Do not: ? Participate in activities where you could fall or become injured. ? Drive. ? Use heavy machinery. ? Drink alcohol. ? Take sleeping pills or medicines that cause drowsiness. ? Make important decisions or sign legal documents. ? Take care of children on your own.  Rest. Eating and drinking  Follow the diet recommended by your health care provider.  If you vomit: ? Drink water, juice, or soup when you can drink without vomiting. ? Make sure you have little or no nausea before eating solid foods. General instructions  Have a responsible adult stay with you until you are awake and alert.  Take over-the-counter and prescription medicines only as told by your health care provider.  If you smoke, do not smoke without supervision.  Keep all follow-up visits as told by your health care provider. This is important. Contact a health care provider if:  You keep feeling nauseous or you keep vomiting.  You feel light-headed.  You develop a rash.  You have a fever. Get help right away if:  You have trouble breathing. This information is not intended to replace advice given to you by your health care provider.  Make sure you discuss any questions you have with your health care provider. Document Revised: 01/23/2017 Document Reviewed: 06/02/2015 Elsevier Patient Education  2020 [employer]  

## 2019-07-21 NOTE — Discharge Summary (Addendum)
Discharge Summary for Same Day PCI   Patient ID: Russell Gregory MRN: [id number]; DOB: [birth year]  Admit date: 07/21/2019 Discharge date: 07/21/2019  Primary Care Provider: Ileana Ladd, MD  Primary Cardiologist: Dr. Delton See Primary Electrophysiologist:  None   Discharge Diagnoses    Principal Problem:   CAD (coronary artery disease) Active Problems:   HLD (hyperlipidemia)   HTN (hypertension)    Diagnostic Studies/Procedures    Cardiac Catheterization 07/21/2019:  Prox RCA lesion is 25% stenosed. Prox LAD lesion is 75% stenosed. Cross-sectional area of 3.53 mm by intravascular ultrasound. This lesion was significant by CT FFR. A drug-eluting stent was successfully placed using a SYNERGY XD 4.0X16. Stent was optimized with intravascular ultrasound. Post intervention, there is a 0% residual stenosis. The left ventricular systolic function is normal. LV end diastolic pressure is normal. The left ventricular ejection fraction is 55-65% by visual estimate. There is no aortic valve stenosis.   Continue aggressive secondary prevention.  I stressed the importance of dual antiplatelet therapy.  He will likely need to be changed to a higher intensity statin as an outpatient.  We will also change his proton pump inhibitor since we have started clopidogrel. _____________   History of Present Illness     Russell Gregory is a 49 y.o. male with strong family history of premature CAD, HLP, hypertension who presented to the office for follow up.  He was seen in 2016 for chest pain and underwent exercise treadmill stress test that was negative for ischemia.  He continued to have episodes of what he called indigestion, he walks a lot at work and does not have chest pain with that but occasionally gets short of breath when he gets emotionally stressed.  He has very significant family history of premature coronary artery disease in multiple cousins, and most recently his 21 year old brother  died in 10/23/20of myocardial infarction.  He had been feeling more short of breath and lightheaded ever since he got the Covid vaccine in February.   He had coronary CTA   Lisinopril increased and BB added.    The cardiac CTA with CAD mod stenosis in mRCA and pLAD  FFR with significant stenosis in pLAD cardiac cath recommended.    He was seen back in the office with Nada Boozer and cardiac cath and risks, procedure and questions answered.  He is on statin but LDL is 126 he has had bone muscle pain with statins in past. Now on zocor 10 mg - will wait on cath to decide statin dose. The lipid clinic was discussed.    Outpatient cardiac catheterization was arranged for further evaluation.  Hospital Course     The patient underwent cardiac cath as noted above with 75% lesion in the pLAD which was significant via CT FFR, successful PCI/DES x1. Plan for DAPT with ASA/plavix for at least 6 months. The patient was seen by cardiac rehab while in short stay. There were no observed complications post cath. Radial cath site was re-evaluated prior to discharge and found to be stable without any complications. In regards to his statin he has a hx of myalgias on several different statins. Outpatient lipid clinic referral placed at discharge.    Russell Gregory was seen by Dr. Eldridge Dace and determined stable for discharge home. Follow up with our office has been arranged. Instructions/precautions regarding cath site care were given prior to discharge. Medications are listed below. Pertinent changes include addition of plavix.  _____________  Did the  patient have an acute coronary syndrome (MI, NSTEMI, STEMI, etc) this admission?:  No                               Did the patient have a percutaneous coronary intervention (stent / angioplasty)?:  Yes.     Cath/PCI Registry Performance & Quality Measures: Aspirin prescribed? - Yes ADP Receptor Inhibitor (Plavix/Clopidogrel, Brilinta/Ticagrelor or  Effient/Prasugrel) prescribed (includes medically managed patients)? - Yes High Intensity Statin (Lipitor 40-80mg  or Crestor 20-40mg ) prescribed? - No - myalgias with statin, lipid clinic referral placed For EF <40%, was ACEI/ARB prescribed? - Not Applicable (EF >/= 40%) For EF <40%, Aldosterone Antagonist (Spironolactone or Eplerenone) prescribed? - Not Applicable (EF >/= 40%) Cardiac Rehab Phase II ordered? - Yes   _____________   Discharge Vitals Blood pressure (!) 145/102, pulse 67, temperature 97.7 F (36.5 C), temperature source Skin, resp. rate (!) 0, height 6' (1.829 m), weight 84.8 kg, SpO2 100 %.  Filed Weights   07/21/19 0908  Weight: 84.8 kg    Last Labs & Radiologic Studies    CBC Recent Labs    07/19/19 1002  WBC 5.8  HGB 13.9  HCT 41.0  MCV 96  PLT 251   Basic Metabolic Panel Recent Labs    16/11/9603/25/21 1002  NA 138  K 4.2  CL 105  CO2 24  GLUCOSE 85  BUN 10  CREATININE 0.89  CALCIUM 9.9   Liver Function Tests No results for input(s): AST, ALT, ALKPHOS, BILITOT, PROT, ALBUMIN in the last 72 hours. No results for input(s): LIPASE, AMYLASE in the last 72 hours. High Sensitivity Troponin:   No results for input(s): TROPONINIHS in the last 720 hours.  BNP Invalid input(s): POCBNP D-Dimer No results for input(s): DDIMER in the last 72 hours. Hemoglobin A1C No results for input(s): HGBA1C in the last 72 hours. Fasting Lipid Panel No results for input(s): CHOL, HDL, LDLCALC, TRIG, CHOLHDL, LDLDIRECT in the last 72 hours. Thyroid Function Tests No results for input(s): TSH, T4TOTAL, T3FREE, THYROIDAB in the last 72 hours.  Invalid input(s): FREET3 _____________  CT CORONARY MORPH W/CTA COR W/SCORE W/CA W/CM &/OR WO/CM  Addendum Date: 07/12/2019   ADDENDUM REPORT: 07/12/2019 12:31 CLINICAL DATA:  49 year old male with h/o hypertension, HLP, FH of premature CAD and chest pain. EXAM: Cardiac/Coronary  CTA TECHNIQUE: The patient was scanned on a [employer]  Force scanner. FINDINGS: A 100 kV prospective scan was triggered in the descending thoracic aorta at 111 HU's. Axial non-contrast 3 mm slices were carried out through the heart. The data set was analyzed on a dedicated work station and scored using the Agatson method. Gantry rotation speed was 250 msecs and collimation was .6 mm. Carvedilol 6.25 mg PO and 0.8 mg of sl NTG was given. The 3D data set was reconstructed in 5% intervals of the 67-82 % of the R-R cycle. Diastolic phases were analyzed on a dedicated work station using MPR, MIP and VRT modes. The patient received 80 cc of contrast. Aorta:  Normal size.  No calcifications.  No dissection. Aortic Valve:  Trileaflet.  No calcifications. Coronary Arteries:  Normal coronary origin.  Right dominance. RCA is a large dominant artery that gives rise to PDA and PLA. There is mild calcified plaque in the proximal RCA with stenosis 25-49%. Mid RCA has moderate non-calcified plaque with stenosis 50-69%. Distal RCA/PLA/PDA have no significant plaque. Left main is a large artery that gives rise  to LAD, two small ramuses and LCX arteries. Left main has no plaque. RI 1,2 are very small branches (2 mm in diameter) with mild calcified plaque. LAD is a large vessel that gives rise to one diagonal artery. There is mild to moderate mixed, predominantly calcified plaque in the proximal LAD with stenosis 25-49% but possibly 50-69%. Mid and distal LAD has no significant plaque. D1 is a small branch with no significant plaque. LCX is a very small non-dominant artery that has no plaque. Other findings: Normal pulmonary vein drainage into the left atrium. Normal left atrial appendage without a thrombus. Normal size of the pulmonary artery. IMPRESSION: 1. Coronary calcium score of 107. This was 58 percentile for age and sex matched control. 2. Normal coronary origin with right dominance. 3. CAD-RADS 3. Moderate stenosis in the mid RCA and possibly in proximal LAD. Consider  symptom-guided anti-ischemic pharmacotherapy as well as risk factor modification per guideline directed care. Additional analysis with CT FFR will be submitted. Electronically Signed   By: Tobias Alexander   On: 07/12/2019 12:31   Result Date: 07/12/2019 EXAM: OVER-READ INTERPRETATION  CT CHEST The following report is an over-read performed by radiologist Dr. Richarda Overlie of [facility] on 07/12/2019. This over-read does not include interpretation of cardiac or coronary anatomy or pathology. The coronary calcium score/coronary CTA interpretation by the cardiologist is attached. COMPARISON:  None. FINDINGS: Vascular: Normal caliber of the visualized thoracic aorta. Pulmonary arteries are not well opacified on this examination. Mediastinum/Nodes: Visualized mediastinal structures are unremarkable. Lungs/Pleura: Tiny calcification along the anterior pleural surface of the right upper lobe on sequence 12, image 2 likely represents a small calcified granuloma. Visualized lungs are clear without large pleural effusions or consolidation. Tiny pleural-based calcification in left lower lobe on sequence 12, image 36. Upper Abdomen: Images of the upper abdomen are unremarkable. Musculoskeletal: No acute bone abnormality. IMPRESSION: Negative over-read examination. Electronically Signed: By: Richarda Overlie M.D. On: 07/12/2019 09:27   CT CORONARY FRACTIONAL FLOW RESERVE DATA PREP  Result Date: 07/13/2019 EXAM: CT FFR ANALYSIS CLINICAL DATA:  49 year old male with abnormal CCTA. FINDINGS: FFRct analysis was performed on the original cardiac CT angiogram dataset. Diagrammatic representation of the FFRct analysis is provided in a separate PDF document in PACS. This dictation was created using the PDF document and an interactive 3D model of the results. 3D model is not available in the EMR/PACS. Normal FFR range is >0.80. 1. Left Main: 0.95. 2. LAD: Proximal: 0.94, mid: 0.77, distal: 0.76. 3. RI: 0.94. 4. LCX: 0.93. 5.  RCA: Proximal: 0.93, 0.86. IMPRESSION: 1. CT FFR analysis showed significant stenosis in the proximal LAD. A cardiac catheterization is recommended. Electronically Signed   By: Tobias Alexander   On: 07/13/2019 16:02    Disposition   Pt is being discharged home today in good condition.  Follow-up Plans & Appointments    Follow-up Information     Lars Masson, MD Follow up on 08/11/2019.   Specialty: Cardiology Why: at 10:20am for your follow up appt Contact information: [address] [place] [address] [phone]           Discharge Instructions     AMB Referral to Advanced Lipid Disorders Clinic   Complete by: As directed    Reason for referral: Follow-up patients for medication management   Provider to see patient: PharmD   Internal Lipid Clinic Referral Scheduling  Internal lipid clinic referrals are providers within [facility], who wish to refer  established patients for routine management (help in starting PCSK9 inhibitor therapy) or advanced therapies.  Internal MD referral criteria:              1. All patients with LDL>190 mg/dL  2. All patients with Triglycerides >500 mg/dL  3. Patients with suspected or confirmed heterozygous familial hyperlipidemia (HeFH) or homozygous familial hyperlipidemia (HoFH)  4. Patients with family history of suspicious for genetic dyslipidemia desiring genetic testing  5. Patients refractory to standard guideline based therapy  6. Patients with statin intolerance (failed 2 statins, one of which must be a high potency statin)  7. Patients who the provider desires to be seen by MD   Internal PharmD referral criteria:   1. Follow-up patients for medication management  2. Follow-up for compliance monitoring  3. Patients for drug education  4. Patients with statin intolerance  5. PCSK9 inhibitor education and prior authorization approvals  6. Patients with triglycerides <500 mg/dL  External Lipid Clinic  Referral  External lipid clinic referrals are for providers outside of [facility], considered new clinic patients - automatically routed to MD schedule   Amb Referral to Cardiac Rehabilitation   Complete by: As directed    Diagnosis: Coronary Stents   After initial evaluation and assessments completed: Virtual Based Care may be provided alone or in conjunction with Phase 2 Cardiac Rehab based on patient barriers.: Yes        Discharge Medications   Allergies as of 07/21/2019       Reactions   Hydrocodone Hives, Nausea And Vomiting   Morphine And Related Nausea And Vomiting   If given too much        Medication List     STOP taking these medications    esomeprazole 20 MG capsule Commonly known as: NEXIUM   etodolac 500 MG tablet Commonly known as: LODINE       TAKE these medications    acetaminophen 325 MG tablet Commonly known as: TYLENOL Take 2 tablets (650 mg total) by mouth every 4 (four) hours as needed (back pain or headache).   aspirin EC 81 MG tablet Take 1 tablet (81 mg total) by mouth daily.   carvedilol 6.25 MG tablet Commonly known as: COREG Take 1 tablet (6.25 mg total) by mouth 2 (two) times daily.   clopidogrel 75 MG tablet Commonly known as: Plavix Take 1 tablet (75 mg total) by mouth daily.   lisinopril 40 MG tablet Commonly known as: ZESTRIL Take 1 tablet (40 mg total) by mouth daily.   nitroGLYCERIN 0.4 MG SL tablet Commonly known as: Nitrostat Place 1 tablet (0.4 mg total) under the tongue every 5 (five) minutes as needed.   pantoprazole 40 MG tablet Commonly known as: Protonix Take 1 tablet (40 mg total) by mouth daily.   simvastatin 10 MG tablet Commonly known as: ZOCOR Take 10 mg by mouth at bedtime.   zolpidem 10 MG tablet Commonly known as: AMBIEN TAKE 1 TABLET BY MOUTH AT BEDTIME What changed:  how much to take how to take this when to take this additional instructions         Allergies Allergies   Allergen Reactions   Hydrocodone Hives and Nausea And Vomiting   Morphine And Related Nausea And Vomiting    If given too much    Outstanding Labs/Studies   Outpt lipid clinic referral at discharge.  Duration of Discharge Encounter   Greater than 30 minutes including physician time.  Signed, Reino Bellis, NP 07/21/2019, 3:55  PM  I have examined the patient and reviewed assessment and plan and discussed with patient.  Agree with above as stated.  S/p LAD stent.  Start Plavix and change PPI to protonix.  WOuld consider changing to high potency statin as an outpatient.  Radial precautions given.  I spoke to his wife and stressed the importance of DAPT.  Lance Muss

## 2019-07-21 NOTE — Interval H&P Note (Signed)
Cath Lab Visit (complete for each Cath Lab visit)  Clinical Evaluation Leading to the Procedure:   ACS: No.  Non-ACS:    Anginal Classification: CCS II  Anti-ischemic medical therapy: Minimal Therapy (1 class of medications)  Non-Invasive Test Results: High-risk stress test findings: cardiac mortality >3%/year  Prior CABG: No previous CABG    Severe prox LAD disease noted by CT scan  History and Physical Interval Note:  07/21/2019 2:03 PM  Russell Gregory  has presented today for surgery, with the diagnosis of abnormal ct - chest pain.  The various methods of treatment have been discussed with the patient and family. After consideration of risks, benefits and other options for treatment, the patient has consented to  Procedure(s): LEFT HEART CATH AND CORONARY ANGIOGRAPHY (N/A) as a surgical intervention.  The patient's history has been reviewed, patient examined, no change in status, stable for surgery.  I have reviewed the patient's chart and labs.  Questions were answered to the patient's satisfaction.     Lance Muss

## 2019-07-22 ENCOUNTER — Telehealth: Payer: Self-pay | Admitting: Cardiology

## 2019-07-22 MED FILL — Nitroglycerin IV Soln 100 MCG/ML in D5W: INTRA_ARTERIAL | Qty: 10 | Status: AC

## 2019-07-22 NOTE — Telephone Encounter (Signed)
   Pt c/o of Chest Pain: STAT if CP now or developed within 24 hours  1. Are you having CP right now? Yes  2. Are you experiencing any other symptoms (ex. SOB, nausea, vomiting, sweating)?   3. How long have you been experiencing CP? This morning having chest tightness  4. Is your CP continuous or coming and going?   5. Have you taken Nitroglycerin?  ?

## 2019-07-22 NOTE — Telephone Encounter (Signed)
   Pt had a heart cath yesterday and was advised needs to f/u with cards so he can go back to work after a week. No available appt for Dr. Delton See as well APPs.

## 2019-07-22 NOTE — Telephone Encounter (Signed)
Left detailed message for Pt.  Advised per Dr. Eldridge Dace that chest tightness is normal 24-48 hours after catheterization.  Advised to continue to monitor and if chest tightness continued to call office next week Tuesday or if pain increased to seek emergency care.  Also advised [facility] number had 24 hour response for issues.  No further action needed at this time.

## 2019-07-22 NOTE — Progress Notes (Signed)
Patient returned post op call and asked if chest tightness was normal.  Patient stated he has had chest tightness.  Asked patient to call 911, get to ED, call cardiologist office and he stated "I will just see how i'm doing".  Spoke with Boneta Lucks triage nurse at Dr. Eldridge Dace office to inform of patient stating he has had some chest tightness.  Boneta Lucks stated she will f/u.

## 2019-07-22 NOTE — Telephone Encounter (Signed)
Dr. Delton See- can you review cath results and see if ok for pt to go back to work after a week?  He is currently scheduled to see you on 6/17 but wanted to return to work prior to that.

## 2019-07-25 NOTE — Telephone Encounter (Signed)
Yes, he can go to work.

## 2019-07-26 NOTE — Telephone Encounter (Signed)
Follow up ° ° °Patient is returning your call. Please call. ° ° ° °

## 2019-07-26 NOTE — Telephone Encounter (Signed)
Pt calling for a return to work letter. Letter placed downstairs at the elevator.

## 2019-07-26 NOTE — Telephone Encounter (Signed)
LVM and sent MyChart message for patient to review.

## 2019-08-11 ENCOUNTER — Ambulatory Visit: Payer: BC Managed Care – PPO | Admitting: Cardiology

## 2019-08-11 ENCOUNTER — Other Ambulatory Visit: Payer: Self-pay

## 2019-08-11 ENCOUNTER — Encounter: Payer: Self-pay | Admitting: Cardiology

## 2019-08-11 VITALS — BP 122/76 | HR 60 | Ht 72.0 in | Wt 187.0 lb

## 2019-08-11 DIAGNOSIS — Z95820 Peripheral vascular angioplasty status with implants and grafts: Secondary | ICD-10-CM

## 2019-08-11 DIAGNOSIS — Z8249 Family history of ischemic heart disease and other diseases of the circulatory system: Secondary | ICD-10-CM

## 2019-08-11 DIAGNOSIS — I25118 Atherosclerotic heart disease of native coronary artery with other forms of angina pectoris: Secondary | ICD-10-CM | POA: Diagnosis not present

## 2019-08-11 DIAGNOSIS — E782 Mixed hyperlipidemia: Secondary | ICD-10-CM

## 2019-08-11 NOTE — Patient Instructions (Signed)
Medication Instructions:   Your physician recommends that you continue on your current medications as directed. Please refer to the Current Medication list given to you today.  *If you need a refill on your cardiac medications before your next appointment, please call your pharmacy*    REFERRAL:  TO OUR LIPID CLINIC HERE IN THE OFFICE TO SEE OUR PHARMACIST FOR CONSIDERATION OF PCSK9-INHIBITORS   Follow-Up:  4 MONTHS IN THE OFFICE WITH DR. Delton See

## 2019-08-11 NOTE — Progress Notes (Signed)
Cardiology Office Note   Date:  08/11/2019   ID:  Russell Gregory, DOB 12/11/1970, MRN [id number]  PCP:  Vernie Shanks, MD  Cardiologist:  Dr. Meda Coffee    Chief complain: post PCI follow up   History of Present Illness:   [name] is a 49 y.o. male with family history of premature CAD, HLP, hypertension. He has very significant family history of premature coronary artery disease in multiple cousins, and most recently his 26 year old brother died in December 15, 2018 of myocardial infarction.  He has been feeling more short of breath and lightheaded ever since he got the Covid vaccine in February. He had abnormal coronary CTA with CAD mod stenosis in mRCA and pLAD --> CT FFR with significant stenosis in pLAD --> cardiac cath showed prox LAD lesion is 75% stenosed. Cross-sectional area of 3.53 mm by intravascular ultrasound. This lesion was significant by CT FFR. A drug-eluting stent was successfully placed using a SYNERGY XD 4.0X16. Stent was optimized with intravascular ultrasound. The left ventricular systolic function is normal. LV end diastolic pressure is normal. The left ventricular ejection fraction is 55-65% by visual estimate. He was started on aspirin and Plavix.   He is coming for a follow up, his DOE has resolved, however he continues to have episodes of indigestion. HE is taking Protonix. He is unable to tolerate higher dose of simvastatin and was intolerant to three other statins I the past.    Past Medical History:  Diagnosis Date  . CAD (coronary artery disease)    PCI/DES to mLAD  . Chest pain at rest 05/05/2014  . Dysuria-frequency syndrome   . GERD (gastroesophageal reflux disease)   . HLD (hyperlipidemia) 07/23/2012  . Hyperlipidemia   . Hypertension   . Inguinal hernia   . Insomnia   . Kidney calculi   . Left hip pain 05/19/2017  . Right ankle injury, subsequent encounter 01/28/2017  . Right ureteral stone     Past Surgical History:  Procedure Laterality  Date  . CORONARY STENT INTERVENTION N/A 07/21/2019   Procedure: CORONARY STENT INTERVENTION;  Surgeon: Jettie Booze, MD;  Location: [facility];  Service: Cardiovascular;  Laterality: N/A;  . CYSTOSCOPY WITH RETROGRADE PYELOGRAM, URETEROSCOPY AND STENT PLACEMENT Right 12/06/2013   Procedure: CYSTOSCOPY WITH RETROGRADE PYELOGRAM, URETEROSCOPY AND STONE BASKETRY;  Surgeon: Malka So, MD;  Location: [facility];  Service: Urology;  Laterality: Right;  . HERNIA REPAIR     left inguinal repair  . INTRAVASCULAR ULTRASOUND/IVUS N/A 07/21/2019   Procedure: Intravascular Ultrasound/IVUS;  Surgeon: Jettie Booze, MD;  Location: [facility];  Service: Cardiovascular;  Laterality: N/A;  . LEFT HEART CATH AND CORONARY ANGIOGRAPHY N/A 07/21/2019   Procedure: LEFT HEART CATH AND CORONARY ANGIOGRAPHY;  Surgeon: Jettie Booze, MD;  Location: [facility];  Service: Cardiovascular;  Laterality: N/A;  . TONSILLECTOMY       Current Outpatient Medications  Medication Sig Dispense Refill  . acetaminophen (TYLENOL) 325 MG tablet Take 2 tablets (650 mg total) by mouth every 4 (four) hours as needed (back pain or headache).    [name] aspirin EC 81 MG tablet Take 1 tablet (81 mg total) by mouth daily. 90 tablet 3  . carvedilol (COREG) 6.25 MG tablet Take 1 tablet (6.25 mg total) by mouth 2 (two) times daily. 180 tablet 2  . clopidogrel (PLAVIX) 75 MG tablet Take 1 tablet (75 mg total) by mouth daily. 90 tablet 2  .  lisinopril (ZESTRIL) 40 MG tablet Take 1 tablet (40 mg total) by mouth daily. 90 tablet 2  . nitroGLYCERIN (NITROSTAT) 0.4 MG SL tablet Place 1 tablet (0.4 mg total) under the tongue every 5 (five) minutes as needed. 25 tablet 2  . pantoprazole (PROTONIX) 40 MG tablet Take 1 tablet (40 mg total) by mouth daily. 30 tablet 11  . simvastatin (ZOCOR) 10 MG tablet Take 10 mg by mouth at bedtime.     [name] zolpidem (AMBIEN) 10 MG tablet TAKE 1 TABLET BY MOUTH AT  BEDTIME 30 tablet 3   No current facility-administered medications for this visit.    Allergies:   Hydrocodone and Russell Gregory and related    Social History:  The patient  reports that he has never smoked. He has never used smokeless tobacco. He reports that he does not drink alcohol and does not use drugs.   Family History:  The patient's family history includes Heart attack in his paternal grandfather and paternal uncle; Heart attack (age of onset: 61) in his brother.    ROS:  General:no colds or fevers, no weight changes Skin:no rashes or ulcers HEENT:no blurred vision, no congestion CV:see HPI PUL:see HPI GI:no diarrhea constipation or melena, no indigestion GU:no hematuria, no dysuria MS:no joint pain, no claudication Neuro:no syncope, no lightheadedness Endo:no diabetes, no thyroid disease  Wt Readings from Last 3 Encounters:  08/11/19 187 lb (84.8 kg)  07/21/19 187 lb (84.8 kg)  07/19/19 190 lb 12.8 oz (86.5 kg)     PHYSICAL EXAM: VS:  BP 122/76   Pulse 60   Ht 6' (1.829 m)   Wt 187 lb (84.8 kg)   SpO2 99%   BMI 25.36 kg/m  , BMI Body mass index is 25.36 kg/m. General:Pleasant affect, NAD Skin:Warm and dry, brisk capillary refill HEENT:normocephalic, sclera clear, mucus membranes moist Neck:supple, no JVD, no bruits  Heart:S1S2 RRR without murmur, gallup, rub or click Lungs:clear without rales, rhonchi, or wheezes JSH:FWYO, non tender, + BS, do not palpate liver spleen or masses Ext:no lower ext edema, 2+ pedal pulses, 2+ radial pulses Neuro:alert and oriented X 3, MAE, follows commands, + facial symmetry  EKG:  EKG is ordered today. The ekg ordered today demonstrates SR, 66 BPM, normal EKG   Recent Labs: 07/19/2019: BUN 10; Creatinine, Ser 0.89; Hemoglobin 13.9; Platelets 251; Potassium 4.2; Sodium 138    Lipid Panel    Component Value Date/Time   CHOL 207 (H) 06/30/2013 0902   CHOL 151 07/23/2012 1059   TRIG 129 06/30/2013 0902   TRIG 114 10/29/2012  1048   TRIG 126 07/23/2012 1059   HDL 39 (L) 06/30/2013 0902   HDL 44 10/29/2012 1048   HDL 40 07/23/2012 1059   CHOLHDL 5.3 (H) 06/30/2013 0902   LDLCALC 142 (H) 06/30/2013 0902   LDLCALC 102 (H) 10/29/2012 1048   LDLCALC 86 07/23/2012 1059       Other studies Reviewed: Additional studies/ records that were reviewed today include: . Cardiac CTA and FFR Aorta:  Normal size.  No calcifications.  No dissection.  Aortic Valve:  Trileaflet.  No calcifications.  Coronary Arteries:  Normal coronary origin.  Right dominance.  RCA is a large dominant artery that gives rise to PDA and PLA. There is mild calcified plaque in the proximal RCA with stenosis 25-49%. Mid RCA has moderate non-calcified plaque with stenosis 50-69%. Distal RCA/PLA/PDA have no significant plaque.  Left main is a large artery that gives rise to LAD, two small ramuses and  LCX arteries. Left main has no plaque.  RI 1,2 are very small branches (2 mm in diameter) with mild calcified plaque.  LAD is a large vessel that gives rise to one diagonal artery. There is mild to moderate mixed, predominantly calcified plaque in the proximal LAD with stenosis 25-49% but possibly 50-69%. Mid and distal LAD has no significant plaque.  D1 is a small branch with no significant plaque.  LCX is a very small non-dominant artery that has no plaque.  Other findings:  Normal pulmonary vein drainage into the left atrium.  Normal left atrial appendage without a thrombus.  Normal size of the pulmonary artery.  IMPRESSION: 1. Coronary calcium score of 107. This was 22 percentile for age and sex matched control.  2. Normal coronary origin with right dominance.  3. CAD-RADS 3. Moderate stenosis in the mid RCA and possibly in proximal LAD. Consider symptom-guided anti-ischemic pharmacotherapy as well as risk factor modification per guideline directed care. Additional analysis with CT FFR will be submitted.   1. Left Main: 0.95.  2. LAD: Proximal: 0.94, mid: 0.77, distal: 0.76. 3. RI: 0.94. 4. LCX: 0.93. 5. RCA: Proximal: 0.93, 0.86.  IMPRESSION: 1. CT FFR analysis showed significant stenosis in the proximal LAD. A cardiac catheterization is recommended.  ASSESSMENT AND PLAN:  1.  CAD, s/p PCI to prox LAD, continue ASA 81 mg po daily, Plavix, lisinopril, coreg, he has normal LVEF. Continue simvastatin for now, refer to the lipid clinic. Encouraged to start exercising 5x/week.  2.  HTN improved control on current meds. Continue   3. HLD intolerant to multiple statin, on low dose of simvastatin now, elevated LDL< goal < 70, we will refer to the lipid clinic for PCSK 9 inhibitor.  4.  GERD on protonix, advised to avoid spicy, acidic foods.  5. Premature family hx of CAD  Current medicines are reviewed with the patient today.  The patient Has no concerns regarding medicines.  The following changes have been made:  See above Labs/ tests ordered today include:see above  Disposition:   FU:  see above  Signed, Tobias Alexander, MD  08/11/2019 7:22 PM    [facility] [address] [place], [place], [place]  [address]/ [address] [place], [place] Phone: [phone]; Fax: [phone]  [phone]

## 2019-08-19 ENCOUNTER — Ambulatory Visit (INDEPENDENT_AMBULATORY_CARE_PROVIDER_SITE_OTHER): Payer: BC Managed Care – PPO | Admitting: Pharmacist

## 2019-08-19 ENCOUNTER — Other Ambulatory Visit: Payer: Self-pay

## 2019-08-19 DIAGNOSIS — I25118 Atherosclerotic heart disease of native coronary artery with other forms of angina pectoris: Secondary | ICD-10-CM

## 2019-08-19 DIAGNOSIS — E782 Mixed hyperlipidemia: Secondary | ICD-10-CM | POA: Diagnosis not present

## 2019-08-19 MED ORDER — ROSUVASTATIN CALCIUM 20 MG PO TABS
20.0000 mg | ORAL_TABLET | Freq: Every day | ORAL | 11 refills | Status: DC
Start: 2019-08-19 — End: 2020-09-10

## 2019-08-19 NOTE — Patient Instructions (Addendum)
Please STOP taking simvastatin START taking rosuvastatin 20mg  daily  I will review your lab work from 08/31/19 We may need to repeat lab work again if LDL not at goal since this is only 2 weeks away, may not be enough time for full effect. I will call you discuss after 7/7  Call 9/7 at [phone] with any questions or concerns

## 2019-08-19 NOTE — Progress Notes (Signed)
Patient ID: Russell Gregory                 DOB: 1970-05-11                    MRN: [id number]     HPI: Russell Gregory is a 49 y.o. male patient referred to lipid clinic by Dr. Meda Coffee. PMH is significant for family history of premature CAD, HLD, HTN and CAD s/p DES .He has very significant family history of premature coronary artery disease in multiple cousins, and most recently his 19 year old brother died in 12/20/2018 of myocardial infarction. He has had an advanced lipid panel done in past with elevated LDL particle # [id number].  Patient presents to clinic today to discuss lipid medication options. Patient is currently on simvastatin 10mg  daily. Ran out a few days ago. Tolerating ok. He states he gets plenty of exercise at work. Walks up and down steps all night. Very interested in preventing his arteries from getting "clogged up" again. His little brother just died at 79 of an MI and his cousins have had premature CAD as well. He states he is getting labs done at PCP on 7/7. He had just worked overnight from 6-6, was in a hurry to go home to go to sleep, understandably.  Current Medications: simvastatin 10mg  daily Intolerances: pravastatin 10mg , pravastatin 20mg , atorvastatin 40mg , vytorin 10/20 (leg pains with all) Risk Factors: CAD, strong family hx, HTN LDL goal: <70 (preferrably <55 per AACE guidelines)  Diet: not discussed   Exercise: very active at work. Gets "plenty of exercise" per report  Family History: The patient's family history includes Heart attack in his paternal grandfather and paternal uncle; Heart attack (age of onset: 7) in his brother.   Social History: The patient  reports that he has never smoked. He has never used smokeless tobacco. He reports that he does not drink alcohol and does not use drugs.    Labs:04/26/19 TC 199, HDL 45, LDL 126, TG 160 (simvastatin 10mg  daily)  Past Medical History:  Diagnosis Date  . CAD (coronary artery disease)    PCI/DES to mLAD    . Chest pain at rest 05/05/2014  . Dysuria-frequency syndrome   . GERD (gastroesophageal reflux disease)   . HLD (hyperlipidemia) 07/23/2012  . Hyperlipidemia   . Hypertension   . Inguinal hernia   . Insomnia   . Kidney calculi   . Left hip pain 05/19/2017  . Right ankle injury, subsequent encounter 01/28/2017  . Right ureteral stone     Current Outpatient Medications on File Prior to Visit  Medication Sig Dispense Refill  . acetaminophen (TYLENOL) 325 MG tablet Take 2 tablets (650 mg total) by mouth every 4 (four) hours as needed (back pain or headache).    [name] aspirin EC 81 MG tablet Take 1 tablet (81 mg total) by mouth daily. 90 tablet 3  . carvedilol (COREG) 6.25 MG tablet Take 1 tablet (6.25 mg total) by mouth 2 (two) times daily. 180 tablet 2  . clopidogrel (PLAVIX) 75 MG tablet Take 1 tablet (75 mg total) by mouth daily. 90 tablet 2  . lisinopril (ZESTRIL) 40 MG tablet Take 1 tablet (40 mg total) by mouth daily. 90 tablet 2  . nitroGLYCERIN (NITROSTAT) 0.4 MG SL tablet Place 1 tablet (0.4 mg total) under the tongue every 5 (five) minutes as needed. 25 tablet 2  . pantoprazole (PROTONIX) 40 MG tablet Take 1 tablet (40 mg total) by mouth daily.  30 tablet 11  . simvastatin (ZOCOR) 10 MG tablet Take 10 mg by mouth at bedtime.     [name] zolpidem (AMBIEN) 10 MG tablet TAKE 1 TABLET BY MOUTH AT BEDTIME 30 tablet 3   No current facility-administered medications on file prior to visit.    Allergies  Allergen Reactions  . Hydrocodone Hives and Nausea And Vomiting  . Morphine And Related Nausea And Vomiting    If given too much    Assessment/Plan:  1. Hyperlipidemia - LDL is above goal of <70 (<70). Patient has not tried rosuvastatin yet. Due to statins strong evidence in lowering cardiovascular risk, I would like him to be the on highest possible tolerated statin dose. Will try rosuvastatin as it is generally better tolerated. Discussed with patient side effects or bilateral muscle pains  that cannot be attributed to other causes.  I gave the patient the option of starting low (5mg ) and increasing as tolerated, or starting at 20mg  and decreasing if needed. Patient desired to start at 20mg . He is having lab work done at his PCP on 7/7. Will review the results from this, although 2 weeks is a little soon to check. Patient is aware that we may have to check again in another 1-2 months. If patient does not tolerate rosuvastatin, we can reduce the dose and add PCSK91. Patient agreeable to the plan.   Thank you,  , Pharm.D, BCPS, CPP [facility]  [address], [place], [place] Floss [address]  Phone: [phone]; Fax: [phone]

## 2019-09-01 ENCOUNTER — Telehealth: Payer: Self-pay | Admitting: Pharmacist

## 2019-09-01 NOTE — Telephone Encounter (Signed)
LDL at PCP office was 94 on rosuvastatin 20. However, patient has only been on for less than 2 weeks. Will repeat lipid panel in another month or so. LVM for pt to call back to schedule

## 2019-09-08 NOTE — Telephone Encounter (Signed)
Pt returned call to clinic, states his PCP scheduled follow up lipid panel in another 3 months. He did not recall the specific date but will let us know when his results are back.

## 2019-09-13 ENCOUNTER — Telehealth: Payer: Self-pay | Admitting: Cardiology

## 2019-09-13 NOTE — Telephone Encounter (Signed)
FMLA/disability form received from [employer]. Placed in box for Dr. Delton See to review. 09/13/19 vlm

## 2019-09-20 NOTE — Telephone Encounter (Signed)
FMLA/Disability form signed by Dr. Delton See and given to Corena Herter in Medical Records, to further follow-up on this matter.

## 2019-10-06 ENCOUNTER — Other Ambulatory Visit: Payer: Self-pay

## 2019-10-06 ENCOUNTER — Ambulatory Visit: Payer: BC Managed Care – PPO | Admitting: Family Medicine

## 2019-10-06 ENCOUNTER — Ambulatory Visit: Payer: Self-pay

## 2019-10-06 VITALS — BP 126/88 | Ht 72.0 in | Wt 175.0 lb

## 2019-10-06 DIAGNOSIS — M7712 Lateral epicondylitis, left elbow: Secondary | ICD-10-CM | POA: Diagnosis not present

## 2019-10-06 MED ORDER — METHYLPREDNISOLONE ACETATE 40 MG/ML IJ SUSP
40.0000 mg | Freq: Once | INTRAMUSCULAR | Status: AC
  Administered 2019-10-06: 40 mg via INTRA_ARTICULAR

## 2019-10-06 NOTE — Progress Notes (Signed)
Russell Gregory - 49 y.o. male MRN [id number]  Date of birth: [birth year]  SUBJECTIVE:  Including CC & ROS.  No chief complaint on file.   Russell Gregory is a 49 y.o. male that is presenting with left elbow pain.  The pain is occurring over the lateral condyle.  Has been ongoing on for weeks.  Has a history of similar pain.  Has received steroid injections in the past which have improved.   Review of Systems See HPI   HISTORY: Past Medical, Surgical, Social, and Family History Reviewed & Updated per EMR.   Pertinent Historical Findings include:  Past Medical History:  Diagnosis Date  . CAD (coronary artery disease)    PCI/DES to mLAD  . Chest pain at rest 05/05/2014  . Dysuria-frequency syndrome   . GERD (gastroesophageal reflux disease)   . HLD (hyperlipidemia) 07/23/2012  . Hyperlipidemia   . Hypertension   . Inguinal hernia   . Insomnia   . Kidney calculi   . Left hip pain 05/19/2017  . Right ankle injury, subsequent encounter 01/28/2017  . Right ureteral stone     Past Surgical History:  Procedure Laterality Date  . CORONARY STENT INTERVENTION N/A 07/21/2019   Procedure: CORONARY STENT INTERVENTION;  Surgeon: Corky Crafts, MD;  Location: [facility];  Service: Cardiovascular;  Laterality: N/A;  . CYSTOSCOPY WITH RETROGRADE PYELOGRAM, URETEROSCOPY AND STENT PLACEMENT Right 12/06/2013   Procedure: CYSTOSCOPY WITH RETROGRADE PYELOGRAM, URETEROSCOPY AND STONE BASKETRY;  Surgeon: Anner Crete, MD;  Location: [facility];  Service: Urology;  Laterality: Right;  . HERNIA REPAIR     left inguinal repair  . INTRAVASCULAR ULTRASOUND/IVUS N/A 07/21/2019   Procedure: Intravascular Ultrasound/IVUS;  Surgeon: Corky Crafts, MD;  Location: [facility];  Service: Cardiovascular;  Laterality: N/A;  . LEFT HEART CATH AND CORONARY ANGIOGRAPHY N/A 07/21/2019   Procedure: LEFT HEART CATH AND CORONARY ANGIOGRAPHY;  Surgeon: Corky Crafts, MD;   Location: [facility];  Service: Cardiovascular;  Laterality: N/A;  . TONSILLECTOMY      Family History  Problem Relation Age of Onset  . Heart attack Brother 42       died of massive MI  . Heart attack Paternal Grandfather   . Heart attack Paternal Uncle   . Colon cancer Neg Hx   . Colon polyps Neg Hx   . Kidney disease Neg Hx   . Esophageal cancer Neg Hx   . Diabetes Neg Hx   . Gallbladder disease Neg Hx   . Stomach cancer Neg Hx     Social History   Socioeconomic History  . Marital status: Married    Spouse name: Not on file  . Number of children: 0  . Years of education: Not on file  . Highest education level: Not on file  Occupational History  . Occupation: Machinest  Tobacco Use  . Smoking status: Never Smoker  . Smokeless tobacco: Never Used  Vaping Use  . Vaping Use: Never used  Substance and Sexual Activity  . Alcohol use: No    Alcohol/week: 0.0 standard drinks  . Drug use: No  . Sexual activity: Not on file  Other Topics Concern  . Not on file  Social History Narrative  . Not on file   Social Determinants of Health   Financial Resource Strain:   . Difficulty of Paying Living Expenses:   Food Insecurity:   . Worried About Programme researcher, broadcasting/film/video in  the Last Year:   . Ran Out of Food in the Last Year:   Transportation Needs:   . Freight forwarder (Medical):   [name] Lack of Transportation (Non-Medical):   Physical Activity:   . Days of Exercise per Week:   . Minutes of Exercise per Session:   Stress:   . Feeling of Stress :   Social Connections:   . Frequency of Communication with Friends and Family:   . Frequency of Social Gatherings with Friends and Family:   . Attends Religious Services:   . Active Member of Clubs or Organizations:   . Attends Banker Meetings:   [name] Marital Status:   Intimate Partner Violence:   . Fear of Current or Ex-Partner:   . Emotionally Abused:   [name] Physically Abused:   . Sexually Abused:       PHYSICAL EXAM:  VS: BP 126/88   Ht 6' (1.829 m)   Wt 175 lb (79.4 kg)   BMI 23.73 kg/m  Physical Exam Gen: NAD, alert, cooperative with exam, well-appearing MSK:  Left elbow: No ecchymosis or swelling. Tenderness palpation of the lateral condyle. Normal range of motion. Normal strength resistance. Neurovascularly intact  Limited ultrasound: Left elbow:  No joint effusion. No increased hyperemia associated at the lateral condyle.  No partial tear is appreciated of the common extensor origin.   Summary: No significant structural changes on ultrasound.  Ultrasound and interpretation by Clare Gandy, MD    Aspiration/Injection Procedure Note Russell Gregory 1971-02-24  Procedure: Injection Indications: Left elbow pain  Procedure Details Consent: Risks of procedure as well as the alternatives and risks of each were explained to the (patient/caregiver).  Consent for procedure obtained. Time Out: Verified patient identification, verified procedure, site/side was marked, verified correct patient position, special equipment/implants available, medications/allergies/relevent history reviewed, required imaging and test results available.  Performed.  The area was cleaned with iodine and alcohol swabs.    The left lateral condyle was injected using 1 cc's of 40 mg Depo-Medrol and 2 cc's of 0.25% bupivacaine with a 25 1 1/2" needle.  Ultrasound was used. Images were obtained in long views showing the injection.     A sterile dressing was applied.  Patient did tolerate procedure well.     ASSESSMENT & PLAN:   Lateral epicondylitis of left elbow Clinical symptoms are suggestive of lateral epicondylitis.   -Counseled on home exercise therapy and supportive care. -Injection. -Could consider physical therapy.

## 2019-10-06 NOTE — Patient Instructions (Signed)
Nicee to meet you Please try ice  Please try the exercises   Please send me a message in MyChart with any questions or updates.  Please see me back in 4 weeks.   --Dr. Jordan Likes

## 2019-10-06 NOTE — Assessment & Plan Note (Signed)
Clinical symptoms are suggestive of lateral epicondylitis.   -Counseled on home exercise therapy and supportive care. -Injection. -Could consider physical therapy.

## 2019-10-10 ENCOUNTER — Telehealth: Payer: Self-pay

## 2019-10-10 DIAGNOSIS — E782 Mixed hyperlipidemia: Secondary | ICD-10-CM

## 2019-10-10 NOTE — Telephone Encounter (Signed)
-----   Message from [place] Floss, RPH-CPP sent at 10/07/2019  7:24 AM EDT -----  ----- Message ----- From: Ol[place] Floss, RPH-CPP Sent: 10/07/2019 To: Ol[place] Floss, RPH-CPP  Set up repeat lipids ----- Message ----- From: Ol[place] Floss, RPH-CPP Sent: 09/01/2019 To: Olene Floss, RPH-CPP  lipids

## 2019-10-10 NOTE — Telephone Encounter (Signed)
Called and lmomed pt to schedule fasting lipids, orders placed.

## 2019-10-18 NOTE — Telephone Encounter (Signed)
Ok to wait until October. Would be helpful to know the date of apt so we can call for results. LVM for pt to call back

## 2019-10-18 NOTE — Telephone Encounter (Signed)
Follow up  Pt is returning call from [place], he said in October he will meet his pcp and will have blood work there. He is asking if his pcp will just send the result for his lipids then.

## 2019-10-27 ENCOUNTER — Other Ambulatory Visit: Payer: BC Managed Care – PPO

## 2019-11-08 ENCOUNTER — Ambulatory Visit: Payer: BC Managed Care – PPO | Admitting: Family Medicine

## 2019-12-09 ENCOUNTER — Other Ambulatory Visit: Payer: BC Managed Care – PPO | Admitting: *Deleted

## 2019-12-09 ENCOUNTER — Other Ambulatory Visit: Payer: Self-pay

## 2019-12-09 DIAGNOSIS — E782 Mixed hyperlipidemia: Secondary | ICD-10-CM

## 2019-12-09 LAB — LIPID PANEL
Chol/HDL Ratio: 3.7 ratio (ref 0.0–5.0)
Cholesterol, Total: 168 mg/dL (ref 100–199)
HDL: 45 mg/dL (ref >39)
LDL Chol Calc (NIH): 100 mg/dL — ABNORMAL HIGH (ref 0–99)
Triglycerides: 127 mg/dL (ref 0–149)
VLDL Cholesterol Cal: 23 mg/dL (ref 5–40)

## 2019-12-12 ENCOUNTER — Telehealth: Payer: Self-pay | Admitting: Pharmacist

## 2019-12-12 MED ORDER — PRALUENT 75 MG/ML ~~LOC~~ SOAJ: 1.0000 "pen " | SUBCUTANEOUS | 11 refills | Status: DC

## 2019-12-12 NOTE — Telephone Encounter (Addendum)
Called pt to review his lipid labs. LDL is 100. Down from 126. Will need to confirm he is taking 20mg  of rosuvastatin.  Goal <70 (ideally <55). Will need to discuss adding Repatha. Left VM for patient to call back. Will also send MyChart message

## 2019-12-12 NOTE — Telephone Encounter (Addendum)
[facility] BIN [id number] PCN: ADV ID [id number] GRP [id number]  PA for Praluent approved (insurance prefers Praluent) Copay card activated [facility]- cost should be $25/month Called and left VM on pt machine to call back for update. Will also need labs set up for 2 months Remind him to continue rosuvastatin 20mg  daily

## 2019-12-12 NOTE — Telephone Encounter (Signed)
Patient called back. He is taking rosuvastatin 20mg  daily. he was hurting a little but feeling better. He is in agreement in starting Repatha. Will submit prior authorization.

## 2019-12-12 NOTE — Telephone Encounter (Signed)
Pt returned call to clinic, relayed below message to him. He prefers to give his first injection in office. He has an appt with Dr Delton See this Thursday. He will bring in his medication so that we can help with his first shot. He prefers to schedule his f/u lab work at that time as well.

## 2019-12-15 ENCOUNTER — Other Ambulatory Visit: Payer: Self-pay

## 2019-12-15 ENCOUNTER — Encounter: Payer: Self-pay | Admitting: Cardiology

## 2019-12-15 ENCOUNTER — Ambulatory Visit: Payer: BC Managed Care – PPO | Admitting: Cardiology

## 2019-12-15 ENCOUNTER — Telehealth: Payer: Self-pay | Admitting: Pharmacist

## 2019-12-15 VITALS — BP 110/80 | HR 54 | Ht 71.0 in | Wt 183.8 lb

## 2019-12-15 DIAGNOSIS — Z8249 Family history of ischemic heart disease and other diseases of the circulatory system: Secondary | ICD-10-CM

## 2019-12-15 DIAGNOSIS — E782 Mixed hyperlipidemia: Secondary | ICD-10-CM

## 2019-12-15 DIAGNOSIS — R079 Chest pain, unspecified: Secondary | ICD-10-CM | POA: Diagnosis not present

## 2019-12-15 DIAGNOSIS — I2510 Atherosclerotic heart disease of native coronary artery without angina pectoris: Secondary | ICD-10-CM | POA: Diagnosis not present

## 2019-12-15 DIAGNOSIS — I10 Essential (primary) hypertension: Secondary | ICD-10-CM

## 2019-12-15 NOTE — Patient Instructions (Signed)
Medication Instructions:  Your provider recommends that you continue on your current medications as directed. Please refer to the Current Medication list given to you today.   *If you need a refill on your cardiac medications before your next appointment, please call your pharmacy*   Follow-Up: At [facility], you and your health needs are our priority.  As part of our continuing mission to provide you with exceptional heart care, we have created designated Provider Care Teams.  These Care Teams include your primary Cardiologist (physician) and Advanced Practice Providers (APPs -  Physician Assistants and Nurse Practitioners) who all work together to provide you with the care you need, when you need it. Your next appointment:   6 month(s) The format for your next appointment:   In Person Provider:   Dr. Delton See

## 2019-12-15 NOTE — Telephone Encounter (Signed)
Pt presents today for Praluent injection training. Pt successfully self-administered first injection of Praluent into his abdomen. He sees his PCP some time in November and wishes for them to check his cholesterol. Advised him that full effects of Praluent will be seen after at least 3 injections. He will call clinic with any concerns.

## 2019-12-15 NOTE — Progress Notes (Signed)
Cardiology Office Note   Date:  12/15/2019   ID:  Russell Gregory, Russell Gregory [birth year], MRN [id number]  PCP:  Ileana Ladd, MD  Cardiologist:  Dr. Delton See    Chief complain: Chest pain, 6 months follow-up  History of Present Illness:  Russell Gregory is a 49 y.o. male with family history of premature CAD, HLP, hypertension. He has very significant family history of premature coronary artery disease in multiple cousins, and most recently his 32 year old brother died in October 19, 2020of myocardial infarction.  He has been feeling more short of breath and lightheaded ever since he got the Covid vaccine in February. He had abnormal coronary CTA with CAD mod stenosis in mRCA and pLAD --> CT FFR with significant stenosis in pLAD --> cardiac cath showed prox LAD lesion is 75% stenosed. Cross-sectional area of 3.53 mm by intravascular ultrasound. This lesion was significant by CT FFR. A drug-eluting stent was successfully placed using a SYNERGY XD 4.0X16. Stent was optimized with intravascular ultrasound. The left ventricular systolic function is normal. LV end diastolic pressure is normal. The left ventricular ejection fraction is 55-65% by visual estimate. He was started on aspirin and Plavix.   The patient is coming after 6 months, he could not tolerate multiple statins but now is tolerating rosuvastatin 20 mg daily with improvement of LDL cholesterol from 129->100, he was prescribed Praluent that he is about to start this week.  He remains active, he likes to hike frequently, and has no chest pain or shortness of breath with activity.  He has been having chronic chest pain in the mornings that he attributes to gastric reflux, he takes Protonix for that.  He denies any lower extremity edema orthopnea or proximal nocturnal dyspnea.   Past Medical History:  Diagnosis Date  . CAD (coronary artery disease)    PCI/DES to mLAD  . Chest pain at rest 05/05/2014  . Dysuria-frequency syndrome   . GERD  (gastroesophageal reflux disease)   . HLD (hyperlipidemia) 07/23/2012  . Hyperlipidemia   . Hypertension   . Inguinal hernia   . Insomnia   . Kidney calculi   . Left hip pain 05/19/2017  . Right ankle injury, subsequent encounter 01/28/2017  . Right ureteral stone     Past Surgical History:  Procedure Laterality Date  . CORONARY STENT INTERVENTION N/A 07/21/2019   Procedure: CORONARY STENT INTERVENTION;  Surgeon: Corky Crafts, MD;  Location: [facility] INVASIVE CV LAB;  Service: Cardiovascular;  Laterality: N/A;  . CYSTOSCOPY WITH RETROGRADE PYELOGRAM, URETEROSCOPY AND STENT PLACEMENT Right 12/06/2013   Procedure: CYSTOSCOPY WITH RETROGRADE PYELOGRAM, URETEROSCOPY AND STONE BASKETRY;  Surgeon: Anner Crete, MD;  Location: [facility];  Service: Urology;  Laterality: Right;  . HERNIA REPAIR     left inguinal repair  . INTRAVASCULAR ULTRASOUND/IVUS N/A 07/21/2019   Procedure: Intravascular Ultrasound/IVUS;  Surgeon: Corky Crafts, MD;  Location: [facility] INVASIVE CV LAB;  Service: Cardiovascular;  Laterality: N/A;  . LEFT HEART CATH AND CORONARY ANGIOGRAPHY N/A 07/21/2019   Procedure: LEFT HEART CATH AND CORONARY ANGIOGRAPHY;  Surgeon: Corky Crafts, MD;  Location: [facility] INVASIVE CV LAB;  Service: Cardiovascular;  Laterality: N/A;  . TONSILLECTOMY       Current Outpatient Medications  Medication Sig Dispense Refill  . Alirocumab (PRALUENT) 75 MG/ML SOAJ Inject 1 pen into the skin every 14 (fourteen) days. 2 mL 11  . aspirin EC 81 MG tablet Take 1 tablet (81 mg total) by mouth daily. 90 tablet  3  . carvedilol (COREG) 6.25 MG tablet Take 1 tablet (6.25 mg total) by mouth 2 (two) times daily. 180 tablet 2  . clopidogrel (PLAVIX) 75 MG tablet Take 1 tablet (75 mg total) by mouth daily. 90 tablet 2  . lisinopril (ZESTRIL) 40 MG tablet Take 1 tablet (40 mg total) by mouth daily. 90 tablet 2  . nitroGLYCERIN (NITROSTAT) 0.4 MG SL tablet Place 1 tablet (0.4 mg total) under the  tongue every 5 (five) minutes as needed. 25 tablet 2  . pantoprazole (PROTONIX) 40 MG tablet Take 1 tablet (40 mg total) by mouth daily. 30 tablet 11  . rosuvastatin (CRESTOR) 20 MG tablet Take 1 tablet (20 mg total) by mouth daily. 30 tablet 11  . zolpidem (AMBIEN) 10 MG tablet TAKE 1 TABLET BY MOUTH AT BEDTIME 30 tablet 3   No current facility-administered medications for this visit.    Allergies:   Hydrocodone and Morphine and related    Social History:  The patient  reports that he has never smoked. He has never used smokeless tobacco. He reports that he does not drink alcohol and does not use drugs.   Family History:  The patient's family history includes Heart attack in his paternal grandfather and paternal uncle; Heart attack (age of onset: 7) in his brother.    ROS:  General:no colds or fevers, no weight changes Skin:no rashes or ulcers HEENT:no blurred vision, no congestion CV:see HPI PUL:see HPI GI:no diarrhea constipation or melena, no indigestion GU:no hematuria, no dysuria MS:no joint pain, no claudication Neuro:no syncope, no lightheadedness Endo:no diabetes, no thyroid disease  Wt Readings from Last 3 Encounters:  12/15/19 183 lb 12.8 oz (83.4 kg)  10/06/19 175 lb (79.4 kg)  08/11/19 187 lb (84.8 kg)     PHYSICAL EXAM: VS:  BP 110/80   Pulse (!) 54   Ht 5\' 11"  (1.803 m)   Wt 183 lb 12.8 oz (83.4 kg)   SpO2 98%   BMI 25.63 kg/m  , BMI Body mass index is 25.63 kg/m. General:Pleasant affect, NAD Skin:Warm and dry, brisk capillary refill HEENT:normocephalic, sclera clear, mucus membranes moist Neck:supple, no JVD, no bruits  Heart:S1S2 RRR without murmur, gallup, rub or click Lungs:clear without rales, rhonchi, or wheezes , non tender, + BS, do not palpate liver spleen or masses Ext:no lower ext edema, 2+ pedal pulses, 2+ radial pulses Neuro:alert and oriented X 3, MAE, follows commands, + facial symmetry  EKG:  EKG is ordered today. The ekg  ordered today demonstrates sinus bradycardia, otherwise normal EKG, unchanged from prior.  This has been personally reviewed.  Recent Labs: 07/19/2019: BUN 10; Creatinine, Ser 0.89; Hemoglobin 13.9; Platelets 251; Potassium 4.2; Sodium 138    Lipid Panel    Component Value Date/Time   CHOL 168 12/09/2019 0816   CHOL 151 07/23/2012 1059   TRIG 127 12/09/2019 0816   TRIG 114 10/29/2012 1048   TRIG 126 07/23/2012 1059   HDL 45 12/09/2019 0816   HDL 44 10/29/2012 1048   HDL 40 07/23/2012 1059   CHOLHDL 3.7 12/09/2019 0816   LDLCALC 100 (H) 12/09/2019 0816   LDLCALC 102 (H) 10/29/2012 1048   LDLCALC 86 07/23/2012 1059     Other studies Reviewed: Additional studies/ records that were reviewed today include: . Cardiac CTA and FFR Aorta:  Normal size.  No calcifications.  No dissection.  Aortic Valve:  Trileaflet.  No calcifications.  Coronary Arteries:  Normal coronary origin.  Right dominance.  RCA is a  large dominant artery that gives rise to PDA and PLA. There is mild calcified plaque in the proximal RCA with stenosis 25-49%. Mid RCA has moderate non-calcified plaque with stenosis 50-69%. Distal RCA/PLA/PDA have no significant plaque.  Left main is a large artery that gives rise to LAD, two small ramuses and LCX arteries. Left main has no plaque.  RI 1,2 are very small branches (2 mm in diameter) with mild calcified plaque.  LAD is a large vessel that gives rise to one diagonal artery. There is mild to moderate mixed, predominantly calcified plaque in the proximal LAD with stenosis 25-49% but possibly 50-69%. Mid and distal LAD has no significant plaque.  D1 is a small branch with no significant plaque.  LCX is a very small non-dominant artery that has no plaque.  Other findings:  Normal pulmonary vein drainage into the left atrium.  Normal left atrial appendage without a thrombus.  Normal size of the pulmonary artery.  IMPRESSION: 1. Coronary  calcium score of 107. This was 14 percentile for age and sex matched control.  2. Normal coronary origin with right dominance.  3. CAD-RADS 3. Moderate stenosis in the mid RCA and possibly in proximal LAD. Consider symptom-guided anti-ischemic pharmacotherapy as well as risk factor modification per guideline directed care. Additional analysis with CT FFR will be submitted.  1. Left Main: 0.95.  2. LAD: Proximal: 0.94, mid: 0.77, distal: 0.76. 3. RI: 0.94. 4. LCX: 0.93. 5. RCA: Proximal: 0.93, 0.86.  IMPRESSION: 1. CT FFR analysis showed significant stenosis in the proximal LAD. A cardiac catheterization is recommended.    ASSESSMENT AND PLAN:  1.  CAD, s/p PCI to prox LAD, continue ASA 81 mg po daily, Plavix, lisinopril, coreg, he has normal LVEF.  Continue aspirin and Plavix for 1 year, he is bruising but no bleeding.  His hemoglobin has been normal.  He is now tolerating rosuvastatin and being started on Praluent.  2.  HTN improved control on current meds. Continue, no episodes of orthostatic hypotension.  3. HLD intolerant to multiple statin, goal less than 70 for LDL, now tolerating rosuvastatin and being started on Praluent as he is LDL is still 100.  4.  GERD on protonix, advised to avoid spicy, acidic foods.  5. Premature family hx of CAD  Current medicines are reviewed with the patient today.  The patient Has no concerns regarding medicines.  The following changes have been made:  See above Labs/ tests ordered today include:see above  Disposition:   FU: In 6 months.  Signed, Tobias Alexander, MD  12/15/2019 8:46 AM    [facility] [address] [place], [place], [place]  [address]/ [address] [place], [place] Phone: [phone]; Fax: [phone]  [phone]

## 2019-12-19 ENCOUNTER — Telehealth: Payer: Self-pay

## 2019-12-19 ENCOUNTER — Encounter: Payer: Self-pay | Admitting: *Deleted

## 2019-12-19 NOTE — Telephone Encounter (Signed)
Patient's wife called needs copy of medical records and a letter why patient needed stents and test for insurance reasons. Give information to Dr. Lindaann Slough nurse

## 2020-01-13 ENCOUNTER — Telehealth: Payer: Self-pay | Admitting: Pharmacist

## 2020-01-13 NOTE — Telephone Encounter (Signed)
Lipids done at PCP office. TC 98, LDL 37, HDL 47, TG 61 Continue rosuvastatin 20mg  daily and Praluent 75mg  q 14 days Called pt and left message on VM per [facility]

## 2020-02-07 ENCOUNTER — Other Ambulatory Visit: Payer: Self-pay | Admitting: Cardiology

## 2020-03-18 ENCOUNTER — Other Ambulatory Visit: Payer: Self-pay | Admitting: Cardiology

## 2020-03-18 DIAGNOSIS — R072 Precordial pain: Secondary | ICD-10-CM

## 2020-03-18 DIAGNOSIS — I10 Essential (primary) hypertension: Secondary | ICD-10-CM

## 2020-03-18 DIAGNOSIS — Z8249 Family history of ischemic heart disease and other diseases of the circulatory system: Secondary | ICD-10-CM

## 2020-03-18 DIAGNOSIS — E782 Mixed hyperlipidemia: Secondary | ICD-10-CM

## 2020-04-15 ENCOUNTER — Other Ambulatory Visit: Payer: Self-pay | Admitting: Cardiology

## 2020-04-15 DIAGNOSIS — I10 Essential (primary) hypertension: Secondary | ICD-10-CM

## 2020-04-15 DIAGNOSIS — E782 Mixed hyperlipidemia: Secondary | ICD-10-CM

## 2020-04-15 DIAGNOSIS — Z8249 Family history of ischemic heart disease and other diseases of the circulatory system: Secondary | ICD-10-CM

## 2020-04-15 DIAGNOSIS — R072 Precordial pain: Secondary | ICD-10-CM

## 2020-05-01 ENCOUNTER — Other Ambulatory Visit: Payer: Self-pay | Admitting: Interventional Cardiology

## 2020-08-07 ENCOUNTER — Other Ambulatory Visit: Payer: Self-pay | Admitting: *Deleted

## 2020-08-07 MED ORDER — PANTOPRAZOLE SODIUM 40 MG PO TBEC
DELAYED_RELEASE_TABLET | ORAL | 0 refills | Status: DC
Start: 2020-08-07 — End: 2020-11-29

## 2020-09-04 ENCOUNTER — Ambulatory Visit: Payer: BC Managed Care – PPO | Admitting: Internal Medicine

## 2020-09-10 ENCOUNTER — Other Ambulatory Visit: Payer: Self-pay | Admitting: *Deleted

## 2020-09-10 MED ORDER — ROSUVASTATIN CALCIUM 20 MG PO TABS
20.0000 mg | ORAL_TABLET | Freq: Every day | ORAL | Status: DC
Start: 2020-09-10 — End: 2020-10-03

## 2020-09-26 ENCOUNTER — Encounter: Payer: Self-pay | Admitting: Internal Medicine

## 2020-09-26 ENCOUNTER — Ambulatory Visit (INDEPENDENT_AMBULATORY_CARE_PROVIDER_SITE_OTHER): Payer: BC Managed Care – PPO | Admitting: Internal Medicine

## 2020-09-26 ENCOUNTER — Other Ambulatory Visit: Payer: Self-pay

## 2020-09-26 VITALS — BP 140/90 | HR 69 | Ht 71.0 in | Wt 179.0 lb

## 2020-09-26 DIAGNOSIS — I10 Essential (primary) hypertension: Secondary | ICD-10-CM

## 2020-09-26 DIAGNOSIS — I2510 Atherosclerotic heart disease of native coronary artery without angina pectoris: Secondary | ICD-10-CM | POA: Diagnosis not present

## 2020-09-26 DIAGNOSIS — E782 Mixed hyperlipidemia: Secondary | ICD-10-CM

## 2020-09-26 DIAGNOSIS — Z720 Tobacco use: Secondary | ICD-10-CM | POA: Diagnosis not present

## 2020-09-26 DIAGNOSIS — I25118 Atherosclerotic heart disease of native coronary artery with other forms of angina pectoris: Secondary | ICD-10-CM
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
## 2020-09-26 NOTE — Patient Instructions (Signed)
Medication Instructions:  Your physician has recommended you make the following change in your medication:  STOP: clopidogrel (Plavix)  *If you need a refill on your cardiac medications before your next appointment, please call your pharmacy*   Lab Work: NONE If you have labs (blood work) drawn today and your tests are completely normal, you will receive your results only by: MyChart Message (if you have MyChart) OR A paper copy in the mail If you have any lab test that is abnormal or we need to change your treatment, we will call you to review the results.   Testing/Procedures: NONE   Follow-Up: At [facility], you and your health needs are our priority.  As part of our continuing mission to provide you with exceptional heart care, we have created designated Provider Care Teams.  These Care Teams include your primary Cardiologist (physician) and Advanced Practice Providers (APPs -  Physician Assistants and Nurse Practitioners) who all work together to provide you with the care you need, when you need it.  We recommend signing up for the patient portal called "MyChart".  Sign up information is provided on this After Visit Summary.  MyChart is used to connect with patients for Virtual Visits (Telemedicine).  Patients are able to view lab/test results, encounter notes, upcoming appointments, etc.  Non-urgent messages can be sent to your provider as well.   To learn more about what you can do with MyChart, go to [email or web address].    Your next appointment:   6 month(s)  The format for your next appointment:   In Person  Provider:   You may see Riley Lam, MD or one of the following Advanced Practice Providers on your designated Care Team:   Ronie Spies, PA-C Jacolyn Reedy, PA-C

## 2020-10-03 ENCOUNTER — Other Ambulatory Visit: Payer: Self-pay | Admitting: Internal Medicine

## 2020-11-27 ENCOUNTER — Other Ambulatory Visit: Payer: Self-pay | Admitting: *Deleted

## 2020-11-29 ENCOUNTER — Other Ambulatory Visit: Payer: Self-pay

## 2020-11-29 MED ORDER — PANTOPRAZOLE SODIUM 40 MG PO TBEC: DELAYED_RELEASE_TABLET | ORAL | 10 refills | Status: DC

## 2020-12-03 ENCOUNTER — Other Ambulatory Visit: Payer: Self-pay | Admitting: Pharmacist

## 2020-12-03 MED ORDER — PRALUENT 75 MG/ML ~~LOC~~ SOAJ: 1.0000 "pen " | SUBCUTANEOUS | 11 refills | Status: DC

## 2021-01-10 ENCOUNTER — Other Ambulatory Visit: Payer: Self-pay

## 2021-01-10 DIAGNOSIS — I10 Essential (primary) hypertension: Secondary | ICD-10-CM

## 2021-01-10 DIAGNOSIS — R072 Precordial pain: Secondary | ICD-10-CM

## 2021-01-10 DIAGNOSIS — Z8249 Family history of ischemic heart disease and other diseases of the circulatory system: Secondary | ICD-10-CM

## 2021-01-10 DIAGNOSIS — E782 Mixed hyperlipidemia: Secondary | ICD-10-CM

## 2021-01-10 MED ORDER — LISINOPRIL 40 MG PO TABS
40.0000 mg | ORAL_TABLET | Freq: Every day | ORAL | 2 refills | Status: DC
Start: 2021-01-10 — End: 2021-08-06

## 2021-01-30 ENCOUNTER — Other Ambulatory Visit: Payer: Self-pay | Admitting: *Deleted

## 2021-01-30 DIAGNOSIS — Z8249 Family history of ischemic heart disease and other diseases of the circulatory system: Secondary | ICD-10-CM

## 2021-01-30 DIAGNOSIS — R072 Precordial pain: Secondary | ICD-10-CM

## 2021-01-30 DIAGNOSIS — E782 Mixed hyperlipidemia: Secondary | ICD-10-CM

## 2021-01-30 DIAGNOSIS — I10 Essential (primary) hypertension: Secondary | ICD-10-CM

## 2021-01-30 MED ORDER — CARVEDILOL 6.25 MG PO TABS: 6.2500 mg | ORAL_TABLET | Freq: Two times a day (BID) | ORAL | 3 refills | Status: DC

## 2021-02-25 DIAGNOSIS — K529 Noninfective gastroenteritis and colitis, unspecified: Secondary | ICD-10-CM | POA: Diagnosis not present

## 2021-02-25 DIAGNOSIS — R110 Nausea: Secondary | ICD-10-CM | POA: Diagnosis not present

## 2021-02-25 DIAGNOSIS — Z681 Body mass index (BMI) 19 or less, adult: Secondary | ICD-10-CM | POA: Diagnosis not present

## 2021-04-03 NOTE — Progress Notes (Deleted)
Cardiology Office Note:    Date:  04/03/2021   ID:  Russell Gregory, DOB 05/19/1970, MRN [id number]  PCP:  Ileana Ladd, MD   [facility] Providers Cardiologist:  Christell Constant, MD     Referring MD: Ileana Ladd, MD   CC: Follow up CAD  History of Present Illness:    Russell Gregory is a 51 y.o. male with a hx of CAD s/p PCI 07/21/2019, Tobacco (chew) HTN, HLD, who presents for follow up.  At 2022 f/u LDL was at goal and plavix was stopped.  Patient notes that he is doing ***.   Since day prior/last visit notes *** . There are no*** interval hospital/ED visit.    No chest pain or pressure ***.  No SOB/DOE*** and no PND/Orthopnea***.  No weight gain or leg swelling***.  No palpitations or syncope ***.  Ambulatory blood pressure ***.   Past Medical History:  Diagnosis Date   CAD (coronary artery disease)    PCI/DES to mLAD   Chest pain at rest 05/05/2014   Dysuria-frequency syndrome    GERD (gastroesophageal reflux disease)    HLD (hyperlipidemia) 07/23/2012   Hyperlipidemia    Hypertension    Inguinal hernia    Insomnia    Kidney calculi    Left hip pain 05/19/2017   Right ankle injury, subsequent encounter 01/28/2017   Right ureteral stone     Past Surgical History:  Procedure Laterality Date   CORONARY STENT INTERVENTION N/A 07/21/2019   Procedure: CORONARY STENT INTERVENTION;  Surgeon: Corky Crafts, MD;  Location: [facility] INVASIVE CV LAB;  Service: Cardiovascular;  Laterality: N/A;   CYSTOSCOPY WITH RETROGRADE PYELOGRAM, URETEROSCOPY AND STENT PLACEMENT Right 12/06/2013   Procedure: CYSTOSCOPY WITH RETROGRADE PYELOGRAM, URETEROSCOPY AND STONE BASKETRY;  Surgeon: Anner Crete, MD;  Location: [facility];  Service: Urology;  Laterality: Right;   HERNIA REPAIR     left inguinal repair   INTRAVASCULAR ULTRASOUND/IVUS N/A 07/21/2019   Procedure: Intravascular Ultrasound/IVUS;  Surgeon: Corky Crafts, MD;  Location: [facility] INVASIVE CV  LAB;  Service: Cardiovascular;  Laterality: N/A;   LEFT HEART CATH AND CORONARY ANGIOGRAPHY N/A 07/21/2019   Procedure: LEFT HEART CATH AND CORONARY ANGIOGRAPHY;  Surgeon: Corky Crafts, MD;  Location: [facility] INVASIVE CV LAB;  Service: Cardiovascular;  Laterality: N/A;   TONSILLECTOMY      Current Medications: No outpatient medications have been marked as taking for the 04/04/21 encounter (Appointment) with Christell Constant, MD.     Allergies:   Hydrocodone and Morphine and related   Social History   Socioeconomic History   Marital status: Married    Spouse name: Not on file   Number of children: 0   Years of education: Not on file   Highest education level: Not on file  Occupational History   Occupation: Machin[employer]  Tobacco Use   Smoking status: Never   Smokeless tobacco: Never  Vaping Use   Vaping Use: Never used  Substance and Sexual Activity   Alcohol use: No    Alcohol/week: 0.0 standard drinks   Drug use: No   Sexual activity: Not on file  Other Topics Concern   Not on file  Social History Narrative   Not on file   Social Determinants of Health   Financial Resource Strain: Not on file  Food Insecurity: Not on file  Transportation Needs: Not on file  Physical Activity: Not on file  Stress: Not on file  Social Connections:  Not on file    Social: Works nights at [employer] in [place]  Family History: The patient's family history includes Heart attack in his paternal grandfather and paternal uncle; Heart attack (age of onset: 30) in his brother. There is no history of Colon cancer, Colon polyps, Kidney disease, Esophageal cancer, Diabetes, Gallbladder disease, or Stomach cancer.  ROS:   Please see the history of present illness.     All other systems reviewed and are negative.  EKGs/Labs/Other Studies Reviewed:    The following studies were reviewed today: EKG:  EKG is  ordered today.  The ekg ordered today demonstrates  04/04/2021: *** 09/26/20: SR rate 69  WNL  Transthoracic Echocardiogram: Date: 04/06/2014 Results: - Left ventricle: The cavity size was normal. Systolic function was    normal. The estimated ejection fraction was in the range of 60%    to 65%. Wall motion was normal; there were no regional wall    motion abnormalities. Doppler parameters are consistent with    abnormal left ventricular relaxation (grade 1 diastolic    dysfunction). There was no evidence of elevated ventricular    filling pressure by Doppler parameters.  - Aortic valve: Trileaflet; normal thickness leaflets. There was    mild regurgitation.  - Mitral valve: Structurally normal valve.  - Left atrium: The atrium was normal in size.  - Right ventricle: Systolic function was normal.  - Right atrium: The atrium was normal in size.  - Tricuspid valve: There was no regurgitation.  - Pulmonic valve: Transvalvular velocity was within the normal    range. There was no evidence for stenosis. There was no    regurgitation.  - Pulmonary arteries: Systolic pressure was within the normal    range.  - Inferior vena cava: The vessel was normal in size.  - Pericardium, extracardiac: There was no pericardial effusion.  Cardiac CT: Date:07/12/2019 Results: IMPRESSION: 1. Coronary calcium score of 107. This was 53 percentile for age and sex matched control.   2. Normal coronary origin with right dominance.   3. CAD-RADS 3. Moderate stenosis in the mid RCA and possibly in proximal LAD. Consider symptom-guided anti-ischemic pharmacotherapy as well as risk factor modification per guideline directed care. Additional analysis with CT FFR will be submitted.   4. CT FFR analysis showed significant stenosis in the proximal LAD. A cardiac catheterization is recommended.    Left/Right Heart Catheterizations: Date: 07/21/19 Results: Prox RCA lesion is 25% stenosed. Prox LAD lesion is 75% stenosed. Cross-sectional area of 3.53 mm by intravascular ultrasound. This lesion  was significant by CT FFR. A drug-eluting stent was successfully placed using a SYNERGY XD 4.0X16. Stent was optimized with intravascular ultrasound. Post intervention, there is a 0% residual stenosis. The left ventricular systolic function is normal. LV end diastolic pressure is normal. The left ventricular ejection fraction is 55-65% by visual estimate. There is no aortic valve stenosis.   Continue aggressive secondary prevention.  I stressed the importance of dual antiplatelet therapy.  He will likely need to be changed to a higher intensity statin as an outpatient.  We will also change his proton pump inhibitor since we have started clopidogrel.   Recent Labs: No results found for requested labs within last 8760 hours.  Recent Lipid Panel    Component Value Date/Time   CHOL 168 12/09/2019 0816   CHOL 151 07/23/2012 1059   TRIG 127 12/09/2019 0816   TRIG 114 10/29/2012 1048   TRIG 126 07/23/2012 1059   HDL 45 12/09/2019  0816   HDL 44 10/29/2012 1048   HDL 40 07/23/2012 1059   CHOLHDL 3.7 12/09/2019 0816   LDLCALC 100 (H) 12/09/2019 0816   LDLCALC 102 (H) 10/29/2012 1048   LDLCALC 86 07/23/2012 1059    Physical Exam:    VS:  There were no vitals taken for this visit.    Wt Readings from Last 3 Encounters:  09/26/20 81.2 kg  12/15/19 83.4 kg  10/06/19 79.4 kg     Gen: *** distress, *** obese/well nourished/malnourished   Neck: No JVD, *** carotid bruit Ears: Homero Fellers Sign Cardiac: No Rubs or Gallops, *** Murmur, ***cardia, *** radial pulses Respiratory: Clear to auscultation bilaterally, *** effort, ***  respiratory rate GI: Soft, nontender, non-distended *** MS: No *** edema; *** moves all extremities Integument: Skin feels *** Neuro:  At time of evaluation, alert and oriented to person/place/time/situation *** Psych: Normal affect, patient feels ***   ASSESSMENT:    No diagnosis found.  PLAN:    Coronary Artery Disease HLD  Tobacco (Chew)- cessation  discussed HTN - asymptomatic  - anatomy: pLAD s/p DES - continue ASA 81 mg - continue rosuavstatin, praluent goal LDL < 70 - continue Coreg 1.88 mg BID - continue nitrates PRN, none needed - continue ACEi 40 mg - will check AMB BP; if persistently elevated will add low dose HCTZ    Medication Adjustments/Labs and Tests Ordered: Current medicines are reviewed at length with the patient today.  Concerns regarding medicines are outlined above.  No orders of the defined types were placed in this encounter.  No orders of the defined types were placed in this encounter.    There are no Patient Instructions on file for this visit.   Signed, Christell Constant, MD  04/03/2021 6:46 PM    [facility]

## 2021-04-04 ENCOUNTER — Ambulatory Visit: Payer: BC Managed Care – PPO | Admitting: Internal Medicine

## 2021-04-07 NOTE — Progress Notes (Signed)
Cardiology Office Note:    Date:  04/08/2021   ID:  ZEVIN NEVARES, DOB 01-12-1971, MRN [id number]  PCP:  Ileana Ladd, MD   [facility] Providers Cardiologist:  Christell Constant, MD     Referring MD: Ileana Ladd, MD   CC: Follow up CAD  History of Present Illness:    Russell Gregory is a 51 y.o. male with a hx of CAD s/p PCI 07/21/2019, Tobacco (chew) HTN, HLD, who presents for follow up.  At 2022 f/u LDL was at goal and plavix was stopped.  Patient notes that he is doing good.   Still working making car batteries. There are no interval hospital/ED visit.    No chest pain or pressure .  No SOB/DOE and no PND/Orthopnea.  No weight gain or leg swelling.  No palpitations or syncope.  Exertional dizziness was his anginal equivalent.    Past Medical History:  Diagnosis Date   CAD (coronary artery disease)    PCI/DES to mLAD   Chest pain at rest 05/05/2014   Dysuria-frequency syndrome    GERD (gastroesophageal reflux disease)    HLD (hyperlipidemia) 07/23/2012   Hyperlipidemia    Hypertension    Inguinal hernia    Insomnia    Kidney calculi    Left hip pain 05/19/2017   Right ankle injury, subsequent encounter 01/28/2017   Right ureteral stone     Past Surgical History:  Procedure Laterality Date   CORONARY STENT INTERVENTION N/A 07/21/2019   Procedure: CORONARY STENT INTERVENTION;  Surgeon: Corky Crafts, MD;  Location: [facility] INVASIVE CV LAB;  Service: Cardiovascular;  Laterality: N/A;   CYSTOSCOPY WITH RETROGRADE PYELOGRAM, URETEROSCOPY AND STENT PLACEMENT Right 12/06/2013   Procedure: CYSTOSCOPY WITH RETROGRADE PYELOGRAM, URETEROSCOPY AND STONE BASKETRY;  Surgeon: Anner Crete, MD;  Location: [facility];  Service: Urology;  Laterality: Right;   HERNIA REPAIR     left inguinal repair   INTRAVASCULAR ULTRASOUND/IVUS N/A 07/21/2019   Procedure: Intravascular Ultrasound/IVUS;  Surgeon: Corky Crafts, MD;  Location: [facility] INVASIVE CV  LAB;  Service: Cardiovascular;  Laterality: N/A;   LEFT HEART CATH AND CORONARY ANGIOGRAPHY N/A 07/21/2019   Procedure: LEFT HEART CATH AND CORONARY ANGIOGRAPHY;  Surgeon: Corky Crafts, MD;  Location: [facility] INVASIVE CV LAB;  Service: Cardiovascular;  Laterality: N/A;   TONSILLECTOMY      Current Medications: Current Meds  Medication Sig   Alirocumab (PRALUENT) 75 MG/ML SOAJ Inject 1 pen into the skin every 14 (fourteen) days.   aspirin EC 81 MG tablet Take 1 tablet (81 mg total) by mouth daily.   carvedilol (COREG) 6.25 MG tablet Take 1 tablet (6.25 mg total) by mouth 2 (two) times daily.   lisinopril (ZESTRIL) 40 MG tablet Take 1 tablet (40 mg total) by mouth daily.   nitroGLYCERIN (NITROSTAT) 0.4 MG SL tablet Place 1 tablet (0.4 mg total) under the tongue every 5 (five) minutes as needed.   pantoprazole (PROTONIX) 40 MG tablet TAKE 1 TABLET (40MG ) BY MOUTH DAILY.   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[employer]  Tobacco Use   Smoking status: Never   Smokeless tobacco: Never  Vaping Use   Vaping Use: Never used  Substance and Sexual Activity   Alcohol use: No    Alcohol/week: 0.0 standard drinks   Drug use: No   Sexual activity: Not on file  Other Topics Concern   Not on file  Social History Narrative   Not on file   Social Determinants of Health   Financial Resource Strain: Not on file  Food Insecurity: Not on file  Transportation Needs: Not on file  Physical Activity: Not on file  Stress: Not on file  Social Connections: Not on file    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 There is no history of Colon cancer, Colon polyps, Kidney disease, Esophageal cancer, Diabetes, Gallbladder disease, or Stomach cancer.  ROS:   Please see the history of present illness.     All other systems reviewed and are negative.  EKGs/Labs/Other Studies Reviewed:    The following studies were reviewed today: EKG:   09/26/20: SR rate 69 WNL  Transthoracic Echocardiogram: Date: 04/06/2014 Results: - Left ventricle: The cavity size was normal. Systolic function was    normal. The estimated ejection fraction was in the range of 60%    to 65%. Wall motion was normal; there were no regional wall    motion abnormalities. Doppler parameters are consistent with    abnormal left ventricular relaxation (grade 1 diastolic    dysfunction). There was no evidence of elevated ventricular    filling pressure by Doppler parameters.  - Aortic valve: Trileaflet; normal thickness leaflets. There was    mild regurgitation.  - Mitral valve: Structurally normal valve.  - Left atrium: The atrium was normal in size.  - Right ventricle: Systolic function was normal.  - Right atrium: The atrium was normal in size.  - Tricuspid valve: There was no regurgitation.  - Pulmonic valve: Transvalvular velocity was within the normal    range. There was no evidence for stenosis. There was no    regurgitation.  - Pulmonary arteries: Systolic pressure was within the normal    range.  - Inferior vena cava: The vessel was normal in size.  - Pericardium, extracardiac: There was no pericardial effusion.  Cardiac CT: Date:07/12/2019 Results: IMPRESSION: 1. Coronary calcium score of 107. This was 25 percentile for age and sex matched control.   2. Normal coronary origin with right dominance.   3. CAD-RADS 3. Moderate stenosis in the  mid RCA and possibly in proximal LAD. Consider symptom-guided anti-ischemic pharmacotherapy as well as risk factor modification per guideline directed care. Additional analysis with CT FFR will be submitted.   4. CT FFR analysis showed significant stenosis in the proximal LAD. A cardiac catheterization is recommended.    Left/Right Heart Catheterizations: Date: 07/21/19 Results: Prox RCA lesion is 25% stenosed. Prox LAD lesion is 75% stenosed. Cross-sectional area of 3.53 mm by intravascular ultrasound. This lesion was significant by CT FFR. A drug-eluting stent was successfully placed using a SYNERGY XD 4.0X16. Stent was optimized with intravascular ultrasound. Post intervention, there is a 0% residual stenosis. The left ventricular systolic function is normal. LV end diastolic pressure is normal. The left ventricular ejection fraction is 55-65% by visual estimate.  There is no aortic valve stenosis.   Continue aggressive secondary prevention.  I stressed the importance of dual antiplatelet therapy.  He will likely need to be changed to a higher intensity statin as an outpatient.  We will also change his proton pump inhibitor since we have started clopidogrel.   Recent Labs: No results found for requested labs within last 8760 hours.  Recent Lipid Panel    Component Value Date/Time   CHOL 168 12/09/2019 0816   CHOL 151 07/23/2012 1059   TRIG 127 12/09/2019 0816   TRIG 114 10/29/2012 1048   TRIG 126 07/23/2012 1059   HDL 45 12/09/2019 0816   HDL 44 10/29/2012 1048   HDL 40 07/23/2012 1059   CHOLHDL 3.7 12/09/2019 0816   LDLCALC 100 (H) 12/09/2019 0816   LDLCALC 102 (H) 10/29/2012 1048   LDLCALC 86 07/23/2012 1059    Physical Exam:    VS:  BP 118/68    Pulse 65    Ht 6' (1.829 m)    Wt 83.5 kg    SpO2 98%    BMI 24.95 kg/m     Wt Readings from Last 3 Encounters:  04/08/21 83.5 kg  09/26/20 81.2 kg  12/15/19 83.4 kg     Gen: No distress   Neck: No JVD Ears: No  Frank Sign Cardiac: No Rubs or Gallops, no Murmur, regular rate +2 radial pulses Respiratory: Clear to auscultation bilaterally, normal effort, normal  respiratory rate GI: Soft, nontender, non-distended  MS: No  edema;  moves all extremities Integument: Skin feels warm Neuro:  At time of evaluation, alert and oriented to person/place/time/situation  Psych: Normal affect, patient feels well   ASSESSMENT:    1. Coronary artery disease involving native coronary artery of native heart with other form of angina pectoris (HCC)   2. Tobacco use   3. Primary hypertension     PLAN:    Coronary Artery Disease HLD  Tobacco (Chew)- cessation discussed again HTN - asymptomatic  - anatomy: pLAD s/p DES - continue ASA 81 mg - continue rosuavstatin, praluent goal LDL < 55 - continue Coreg 4.096.25 mg BID - continue nitrates PRN, none needed ever - continue ACEi 40 mg    Medication Adjustments/Labs and Tests Ordered: Current medicines are reviewed at length with the patient today.  Concerns regarding medicines are outlined above.  No orders of the defined types were placed in this encounter.  No orders of the defined types were placed in this encounter.    Patient Instructions  Medication Instructions:  Your physician recommends that you continue on your current medications as directed. Please refer to the Current Medication list given to you today.  *If you need a refill on your cardiac medications before your next appointment, please call your pharmacy*   Lab Work: NONE If you have labs (blood work) drawn today and your tests are completely normal, you will receive your results only by: MyChart Message (if you have MyChart) OR A paper copy in the mail If you have any lab test that is abnormal or we need to change your treatment, we will call you to review the results.   Testing/Procedures: NONE   Follow-Up: At [facility], you and your health needs are our priority.  As  part of our continuing mission to provide you with exceptional heart care, we have created designated Provider Care Teams.  These Care Teams include your primary Cardiologist (physician) and Advanced Practice Providers (APPs -  Physician Assistants and  Nurse Practitioners) who all work together to provide you with the care you need, when you need it.    Your next appointment:   1 year(s)  The format for your next appointment:   In Person  Provider:   Christell Constant, MD      Signed, Christell Constant, MD  04/08/2021 10:05 AM    [facility]

## 2021-04-08 ENCOUNTER — Encounter: Payer: Self-pay | Admitting: Internal Medicine

## 2021-04-08 ENCOUNTER — Other Ambulatory Visit: Payer: Self-pay

## 2021-04-08 ENCOUNTER — Ambulatory Visit: Payer: BC Managed Care – PPO | Admitting: Internal Medicine

## 2021-04-08 VITALS — BP 118/68 | HR 65 | Ht 72.0 in | Wt 184.0 lb

## 2021-04-08 DIAGNOSIS — I25118 Atherosclerotic heart disease of native coronary artery with other forms of angina pectoris: Secondary | ICD-10-CM

## 2021-04-08 DIAGNOSIS — Z720 Tobacco use: Secondary | ICD-10-CM | POA: Diagnosis not present

## 2021-04-08 DIAGNOSIS — I10 Essential (primary) hypertension: Secondary | ICD-10-CM | POA: Diagnosis not present

## 2021-04-08 NOTE — Patient Instructions (Signed)
Medication Instructions:  °Your physician recommends that you continue on your current medications as directed. Please refer to the Current Medication list given to you today. ° °*If you need a refill on your cardiac medications before your next appointment, please call your pharmacy* ° ° °Lab Work: °NONE °If you have labs (blood work) drawn today and your tests are completely normal, you will receive your results only by: °MyChart Message (if you have MyChart) OR °A paper copy in the mail °If you have any lab test that is abnormal or we need to change your treatment, we will call you to review the results. ° ° °Testing/Procedures: °NONE ° ° °Follow-Up: °At [facility], you and your health needs are our priority.  As part of our continuing mission to provide you with exceptional heart care, we have created designated Provider Care Teams.  These Care Teams include your primary Cardiologist (physician) and Advanced Practice Providers (APPs -  Physician Assistants and Nurse Practitioners) who all work together to provide you with the care you need, when you need it. ° ° °Your next appointment:   °1 year(s) ° °The format for your next appointment:   °In Person ° °Provider:   °Mahesh A Chandrasekhar, MD  °  ° ° ° ° °

## 2021-06-18 DIAGNOSIS — Z5181 Encounter for therapeutic drug level monitoring: Secondary | ICD-10-CM | POA: Diagnosis not present

## 2021-06-18 DIAGNOSIS — Z955 Presence of coronary angioplasty implant and graft: Secondary | ICD-10-CM | POA: Diagnosis not present

## 2021-06-18 DIAGNOSIS — G4700 Insomnia, unspecified: Secondary | ICD-10-CM | POA: Diagnosis not present

## 2021-06-18 DIAGNOSIS — Z8249 Family history of ischemic heart disease and other diseases of the circulatory system: Secondary | ICD-10-CM | POA: Diagnosis not present

## 2021-06-18 DIAGNOSIS — R739 Hyperglycemia, unspecified: Secondary | ICD-10-CM | POA: Diagnosis not present

## 2021-06-18 DIAGNOSIS — Z125 Encounter for screening for malignant neoplasm of prostate: Secondary | ICD-10-CM | POA: Diagnosis not present

## 2021-08-01 DIAGNOSIS — R1084 Generalized abdominal pain: Secondary | ICD-10-CM | POA: Diagnosis not present

## 2021-08-01 DIAGNOSIS — N200 Calculus of kidney: Secondary | ICD-10-CM | POA: Diagnosis not present

## 2021-08-01 DIAGNOSIS — K5730 Diverticulosis of large intestine without perforation or abscess without bleeding: Secondary | ICD-10-CM | POA: Diagnosis not present

## 2021-08-01 DIAGNOSIS — K4090 Unilateral inguinal hernia, without obstruction or gangrene, not specified as recurrent: Secondary | ICD-10-CM | POA: Diagnosis not present

## 2021-08-01 DIAGNOSIS — I700 Atherosclerosis of aorta: Secondary | ICD-10-CM | POA: Diagnosis not present

## 2021-08-04 ENCOUNTER — Other Ambulatory Visit: Payer: Self-pay | Admitting: Internal Medicine

## 2021-08-04 DIAGNOSIS — E782 Mixed hyperlipidemia: Secondary | ICD-10-CM

## 2021-08-04 DIAGNOSIS — I10 Essential (primary) hypertension: Secondary | ICD-10-CM

## 2021-08-04 DIAGNOSIS — Z8249 Family history of ischemic heart disease and other diseases of the circulatory system: Secondary | ICD-10-CM

## 2021-08-04 DIAGNOSIS — R072 Precordial pain: Secondary | ICD-10-CM

## 2021-08-29 ENCOUNTER — Telehealth: Payer: Self-pay | Admitting: Pharmacist

## 2021-08-29 MED ORDER — REPATHA SURECLICK 140 MG/ML ~~LOC~~ SOAJ: 1.0000 | SUBCUTANEOUS | 11 refills | Status: DC

## 2021-08-29 NOTE — Telephone Encounter (Signed)
Patient returned call. Made aware of the formulary change. Requested that I e-mail him the copay card info. Email sent and Rx sent to pharmacy.

## 2021-08-29 NOTE — Telephone Encounter (Signed)
Pt's formulary changed from Praluent to Repatha. Repatha authorization has been approved.          Copay card:      Johnney Killian: [id number]      PCN: [id number]      GRP: [id number]      ID: [id number]            Left message for pt to make him aware of med change. Will need new rx sent to pharmacy and copay card info sent to pt in MyChart message once he is made aware of med change.

## 2021-08-31 ENCOUNTER — Other Ambulatory Visit: Payer: Self-pay | Admitting: Internal Medicine

## 2021-09-02 NOTE — Telephone Encounter (Signed)
Pt's pharmacy is requesting a refill on pantoprazole. Would Dr. Izora Ribas like to refill this medication? Please address

## 2021-11-01 DIAGNOSIS — R7303 Prediabetes: Secondary | ICD-10-CM | POA: Diagnosis not present

## 2021-11-01 DIAGNOSIS — Z955 Presence of coronary angioplasty implant and graft: Secondary | ICD-10-CM | POA: Diagnosis not present

## 2021-11-01 DIAGNOSIS — I10 Essential (primary) hypertension: Secondary | ICD-10-CM | POA: Diagnosis not present

## 2021-11-01 DIAGNOSIS — E785 Hyperlipidemia, unspecified: Secondary | ICD-10-CM | POA: Diagnosis not present

## 2021-11-14 DIAGNOSIS — B028 Zoster with other complications: Secondary | ICD-10-CM | POA: Diagnosis not present

## 2021-11-17 DIAGNOSIS — B029 Zoster without complications: Secondary | ICD-10-CM | POA: Diagnosis not present

## 2021-11-20 DIAGNOSIS — B029 Zoster without complications: Secondary | ICD-10-CM | POA: Diagnosis not present

## 2021-11-29 ENCOUNTER — Other Ambulatory Visit: Payer: Self-pay | Admitting: Internal Medicine

## 2021-12-18 DIAGNOSIS — M542 Cervicalgia: Secondary | ICD-10-CM | POA: Diagnosis not present

## 2021-12-23 DIAGNOSIS — M9902 Segmental and somatic dysfunction of thoracic region: Secondary | ICD-10-CM | POA: Diagnosis not present

## 2021-12-23 DIAGNOSIS — M50322 Other cervical disc degeneration at C5-C6 level: Secondary | ICD-10-CM | POA: Diagnosis not present

## 2021-12-23 DIAGNOSIS — M9901 Segmental and somatic dysfunction of cervical region: Secondary | ICD-10-CM | POA: Diagnosis not present

## 2021-12-24 DIAGNOSIS — M9902 Segmental and somatic dysfunction of thoracic region: Secondary | ICD-10-CM | POA: Diagnosis not present

## 2021-12-24 DIAGNOSIS — M50322 Other cervical disc degeneration at C5-C6 level: Secondary | ICD-10-CM | POA: Diagnosis not present

## 2021-12-24 DIAGNOSIS — M9901 Segmental and somatic dysfunction of cervical region: Secondary | ICD-10-CM | POA: Diagnosis not present

## 2021-12-26 DIAGNOSIS — M9901 Segmental and somatic dysfunction of cervical region: Secondary | ICD-10-CM | POA: Diagnosis not present

## 2021-12-26 DIAGNOSIS — M50322 Other cervical disc degeneration at C5-C6 level: Secondary | ICD-10-CM | POA: Diagnosis not present

## 2021-12-26 DIAGNOSIS — M9902 Segmental and somatic dysfunction of thoracic region: Secondary | ICD-10-CM | POA: Diagnosis not present

## 2022-03-08 ENCOUNTER — Emergency Department (HOSPITAL_BASED_OUTPATIENT_CLINIC_OR_DEPARTMENT_OTHER): Payer: BC Managed Care – PPO

## 2022-03-08 ENCOUNTER — Encounter (HOSPITAL_BASED_OUTPATIENT_CLINIC_OR_DEPARTMENT_OTHER): Payer: Self-pay

## 2022-03-08 ENCOUNTER — Emergency Department (HOSPITAL_BASED_OUTPATIENT_CLINIC_OR_DEPARTMENT_OTHER)
Admission: EM | Admit: 2022-03-08 | Discharge: 2022-03-08 | Disposition: A | Discharge status: Home / Self Care | Payer: BC Managed Care – PPO | Attending: Emergency Medicine | Admitting: Emergency Medicine

## 2022-03-08 DIAGNOSIS — I10 Essential (primary) hypertension: Secondary | ICD-10-CM | POA: Insufficient documentation

## 2022-03-08 DIAGNOSIS — I2510 Atherosclerotic heart disease of native coronary artery without angina pectoris: Secondary | ICD-10-CM | POA: Diagnosis not present

## 2022-03-08 DIAGNOSIS — Z79899 Other long term (current) drug therapy: Secondary | ICD-10-CM | POA: Diagnosis not present

## 2022-03-08 DIAGNOSIS — R1031 Right lower quadrant pain: Secondary | ICD-10-CM | POA: Insufficient documentation

## 2022-03-08 DIAGNOSIS — N200 Calculus of kidney: Secondary | ICD-10-CM | POA: Diagnosis not present

## 2022-03-08 DIAGNOSIS — R109 Unspecified abdominal pain: Secondary | ICD-10-CM | POA: Diagnosis not present

## 2022-03-08 DIAGNOSIS — Z7982 Long term (current) use of aspirin: Secondary | ICD-10-CM | POA: Insufficient documentation

## 2022-03-08 LAB — COMPREHENSIVE METABOLIC PANEL
ALT: 17 U/L (ref 0–44)
AST: 18 U/L (ref 15–41)
Albumin: 4.7 g/dL (ref 3.5–5.0)
Alkaline Phosphatase: 59 U/L (ref 38–126)
Anion gap: 15 (ref 5–15)
BUN: 13 mg/dL (ref 6–20)
CO2: 23 mmol/L (ref 22–32)
Calcium: 10.3 mg/dL (ref 8.9–10.3)
Chloride: 98 mmol/L (ref 98–111)
Creatinine, Ser: 0.89 mg/dL (ref 0.61–1.24)
GFR, Estimated: >60 mL/min (ref >60)
Glucose, Bld: 114 mg/dL — ABNORMAL HIGH (ref 70–99)
Potassium: 3.9 mmol/L (ref 3.5–5.1)
Sodium: 136 mmol/L (ref 135–145)
Total Bilirubin: 0.6 mg/dL (ref 0.3–1.2)
Total Protein: 7.9 g/dL (ref 6.5–8.1)

## 2022-03-08 LAB — URINALYSIS, ROUTINE W REFLEX MICROSCOPIC
Bilirubin Urine: NEGATIVE
Glucose, UA: NEGATIVE mg/dL
Hgb urine dipstick: NEGATIVE
Ketones, ur: 40 mg/dL — AB
Leukocytes,Ua: NEGATIVE
Nitrite: NEGATIVE
Protein, ur: NEGATIVE mg/dL
Specific Gravity, Urine: 1.018 (ref 1.005–1.030)
pH: 8 (ref 5.0–8.0)

## 2022-03-08 LAB — CBC
HCT: 40.3 % (ref 39.0–52.0)
Hemoglobin: 13.9 g/dL (ref 13.0–17.0)
MCH: 32.3 pg (ref 26.0–34.0)
MCHC: 34.5 g/dL (ref 30.0–36.0)
MCV: 93.5 fL (ref 80.0–100.0)
Platelets: 253 10*3/uL (ref 150–400)
RBC: 4.31 MIL/uL (ref 4.22–5.81)
RDW: 12.1 % (ref 11.5–15.5)
WBC: 11.8 10*3/uL — ABNORMAL HIGH (ref 4.0–10.5)
nRBC: 0 % (ref 0.0–0.2)

## 2022-03-08 LAB — LIPASE, BLOOD: Lipase: 19 U/L (ref 11–51)

## 2022-03-08 MED ORDER — IOHEXOL 300 MG/ML  SOLN
100.0000 mL | Freq: Once | INTRAMUSCULAR | Status: AC | PRN
  Administered 2022-03-08: 100 mL via INTRAVENOUS

## 2022-03-08 MED ORDER — FENTANYL CITRATE PF 50 MCG/ML IJ SOSY
50.0000 ug | PREFILLED_SYRINGE | INTRAMUSCULAR | Status: DC | PRN
  Administered 2022-03-08: 50 ug via INTRAVENOUS
  Filled 2022-03-08: qty 1

## 2022-03-08 MED ORDER — ONDANSETRON HCL 4 MG/2ML IJ SOLN
4.0000 mg | Freq: Once | INTRAMUSCULAR | Status: AC
  Administered 2022-03-08: 4 mg via INTRAVENOUS
  Filled 2022-03-08: qty 2

## 2022-03-08 MED ORDER — ONDANSETRON HCL 4 MG PO TABS: 4.0000 mg | ORAL_TABLET | Freq: Four times a day (QID) | ORAL | 0 refills | Status: AC

## 2022-03-08 NOTE — Discharge Instructions (Addendum)
You are seen today for abdominal pain.  Your blood work and CT scan were reassuring.  That you follow-up closely with her primary care doctor especially if you have continued abdominal pain.  Come back to the ER if you have any new or worsening symptoms, specially if you have fever or worsening pain or any other concerning symptoms.  Sometimes early appendicitis does not show up  on CT scan so it is very important that if your symptoms progress you come back.  Your blood pressure was elevated today which may be secondary to pain.  It is important that you follow-up closely with your doctor to have this rechecked.

## 2022-03-08 NOTE — ED Notes (Signed)
Pt refused other vital signs. Wants to go home. Dc instructions reviewed with patient. Patient voiced understanding. Dc with belongings.

## 2022-03-08 NOTE — ED Triage Notes (Signed)
He c/o abrupt onset of rlq aarea abd. Pain at 0300 today, which persists. He is grimacing, and a bit hyperactive, as one in much pain. He denies fever/v/d; but does c/o some nausea. He volunteers "I've had kidney stones before, but the pain wasn't like this is" [sic].

## 2022-03-08 NOTE — ED Provider Notes (Signed)
[facility] Provider Note   CSN: [id number] Arrival date & time: 03/08/22  6387     History  Chief Complaint  Patient presents with   Abdominal Pain    Russell Gregory is a 52 y.o. male.  He has past medical history of CAD, hypertension, kidney stones.  Presents the ER with complaints of right lower quadrant abdominal pain since roughly 3 AM that woke him from sleep, he has been very comfortable comfortable since that time with some nausea   Abdominal Pain      Home Medications Prior to Admission medications   Medication Sig Start Date End Date Taking? Authorizing Provider  aspirin EC 81 MG tablet Take 1 tablet (81 mg total) by mouth daily. 06/07/19   Dorothy Spark, MD  carvedilol (COREG) 6.25 MG tablet Take 1 tablet (6.25 mg total) by mouth 2 (two) times daily. 01/30/21   Chandrasekhar, Mahesh A, MD  Evolocumab (REPATHA SURECLICK) 564 MG/ML SOAJ Inject 1 Pen into the skin every 14 (fourteen) days. 08/29/21   Chandrasekhar, Mahesh A, MD  lisinopril (ZESTRIL) 40 MG tablet TAKE 1 TABLET BY MOUTH EVERY DAY 08/06/21   Chandrasekhar, Mahesh A, MD  nitroGLYCERIN (NITROSTAT) 0.4 MG SL tablet Place 1 tablet (0.4 mg total) under the tongue every 5 (five) minutes as needed. 07/21/19   Cheryln Manly, NP  pantoprazole (PROTONIX) 40 MG tablet TAKE 1 TABLET (40MG ) BY MOUTH DAILY. 09/02/21   Chandrasekhar, Mahesh A, MD  rosuvastatin (CRESTOR) 20 MG tablet TAKE 1 TABLET BY MOUTH EVERY DAY 12/02/21   Chandrasekhar, Mahesh A, MD  zolpidem (AMBIEN) 10 MG tablet TAKE 1 TABLET BY MOUTH AT BEDTIME 06/30/13   Vernie Shanks, MD      Allergies    Hydrocodone and Morphine and related    Review of Systems   Review of Systems  Gastrointestinal:  Positive for abdominal pain.    Physical Exam Updated Vital Signs BP (!) 170/94 (BP Location: Right Arm)   Pulse (!) 58   Temp 97.7 F (36.5 C) (Oral)   Resp (!) 22   SpO2 98%  Physical Exam Constitutional:       Comments: Uncomfortable appearing, changing positions frequently  Neurological:     Mental Status: He is alert.     ED Results / Procedures / Treatments   Labs (all labs ordered are listed, but only abnormal results are displayed) Labs Reviewed  COMPREHENSIVE METABOLIC PANEL - Abnormal; Notable for the following components:      Result Value   Glucose, Bld 114 (*)    All other components within normal limits  CBC - Abnormal; Notable for the following components:   WBC 11.8 (*)    All other components within normal limits  URINALYSIS, ROUTINE W REFLEX MICROSCOPIC - Abnormal; Notable for the following components:   Ketones, ur 40 (*)    All other components within normal limits  LIPASE, BLOOD    EKG None  Radiology No results found.  Procedures Procedures    Medications Ordered in ED Medications  fentaNYL (SUBLIMAZE) injection 50 mcg (50 mcg Intravenous Given 03/08/22 0857)  ondansetron (ZOFRAN) injection 4 mg (4 mg Intravenous Given 03/08/22 0857)    ED Course/ Medical Decision Making/ A&P Clinical Course as of 03/08/22 1105  Sat Mar 08, 2022  1024 CT ABDOMEN PELVIS W CONTRAST [CB]    Clinical Course User Index [CB] Sherrye Payor A, PA-C  Medical Decision Making This patient presents to the ED for concern of right lower quadrant abdominal pain that started last night, this involves an extensive number of treatment options, and is a complaint that carries with it a high risk of complications and morbidity.  The differential diagnosis includes appendicitis, kidney stone, cholecystitis, cholelithiasis, diverticulitis, gastritis, other   Co morbidities that complicate the patient evaluation  Heart Disease, hypertension   Additional history obtained:  Additional history obtained from EMR External records from outside source obtained and reviewed including outpatient cardiology note   Lab Tests:  I Ordered, and personally  interpreted labs.  The pertinent results include: Mild leukocytosis,    Imaging Studies ordered:  I ordered imaging studies including ET abdomen pelvis with contrast I independently visualized and interpreted imaging which showed bilateral renal cysts that need no further follow-up, left lower pole nonobstructing kidney stone, no appendix cellulitis, no gallstones or cholecystitis findings, no acute abnormalities I agree with the radiologist interpretation    Problem List / ED Course / Critical interventions / Medication management  Left lower abdominal pain-patient was having left lower abdominal pain, abdomen soft with moderate left lower and mid abdominal tenderness but with negative Rovsing sign with no rebound guarding or rigidity.  Patient was uncomfortable and given fentanyl without relief.  I went back in to discuss results and offered further pain relief but patient refused stating he only wanted to leave.  I discussed with him that we cannot rule out findings like early appendicitis that did not show up on the radiographs.  Advised on close outpatient follow-up with his primary care doctor and strict return precautions. I ordered medication including fentanyl for pain Reevaluation of the patient after these medicines showed that the patient improved I have reviewed the patients home medicines and have made adjustments as needed     Amount and/or Complexity of Data Reviewed Labs: ordered.  Risk Prescription drug management.           Final Clinical Impression(s) / ED Diagnoses Final diagnoses:  None    Rx / DC Orders ED Discharge Orders     None         Darci Current 03/08/22 1132    Fredia Sorrow, MD 03/09/22 [phone]

## 2022-03-20 ENCOUNTER — Other Ambulatory Visit: Payer: Self-pay | Admitting: Internal Medicine

## 2022-03-20 DIAGNOSIS — Z8249 Family history of ischemic heart disease and other diseases of the circulatory system: Secondary | ICD-10-CM

## 2022-03-20 DIAGNOSIS — I10 Essential (primary) hypertension: Secondary | ICD-10-CM

## 2022-03-20 DIAGNOSIS — E782 Mixed hyperlipidemia: Secondary | ICD-10-CM

## 2022-03-20 DIAGNOSIS — R072 Precordial pain: Secondary | ICD-10-CM

## 2022-04-09 NOTE — Progress Notes (Unsigned)
Office Visit    Patient Name: Russell Gregory Date of Encounter: 04/10/2022  PCP:  Vernie Shanks, MD (Inactive)   [place]  Cardiologist:  Werner Lean, MD  Advanced Practice Provider:  No care team member to display Electrophysiologist:  None   HPI    Russell Gregory is a 52 y.o. male with past medical history of CAD status post PCI 07/21/2019, tobacco (2) use, HTN, HLD presents today for follow-up appointment.  The 2022 follow-up LDL was at goal and Plavix was stopped.  Patient was noted to be doing well.  Was still working on making car batteries.  No interval hospital or ED visits.  He was seen 04/08/21 and did not have any chest pain or pressure at that time.  No DOE or SOB.  No orthopnea or PND.  No weight gain or leg swelling.  No dizziness which was his anginal equivalent.  No palpitations or syncope.  Today, he states he has had no issues with his medications. He feels good without chest pain or SOB. 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 He has had no chest pain, SOB, syncope, presyncope. No palpitations.  Reports no shortness of breath nor dyspnea on exertion. Reports no chest pain, pressure, or tightness. No edema, orthopnea, PND. Reports no palpitations.    Past Medical History    Past Medical History:  Diagnosis Date   CAD (coronary artery disease)    PCI/DES to mLAD   Chest pain at rest 05/05/2014   Dysuria-frequency syndrome    GERD (gastroesophageal reflux disease)    HLD (hyperlipidemia) 07/23/2012   Hyperlipidemia    Hypertension    Inguinal hernia    Insomnia    Kidney calculi    Left hip pain 05/19/2017   Right ankle injury, subsequent encounter 01/28/2017   Right ureteral stone    Past Surgical History:  Procedure Laterality Date   CORONARY STENT INTERVENTION N/A 07/21/2019   Procedure: CORONARY STENT INTERVENTION;  Surgeon: Jettie Booze, MD;  Location: [facility];  Service:  Cardiovascular;  Laterality: N/A;   CYSTOSCOPY WITH RETROGRADE PYELOGRAM, URETEROSCOPY AND STENT PLACEMENT Right 12/06/2013   Procedure: CYSTOSCOPY WITH RETROGRADE PYELOGRAM, URETEROSCOPY AND STONE BASKETRY;  Surgeon: Malka So, MD;  Location: [facility];  Service: Urology;  Laterality: Right;   HERNIA REPAIR     left inguinal repair   INTRAVASCULAR ULTRASOUND/IVUS N/A 07/21/2019   Procedure: Intravascular Ultrasound/IVUS;  Surgeon: Jettie Booze, MD;  Location: [facility];  Service: Cardiovascular;  Laterality: N/A;   LEFT HEART CATH AND CORONARY ANGIOGRAPHY N/A 07/21/2019   Procedure: LEFT HEART CATH AND CORONARY ANGIOGRAPHY;  Surgeon: Jettie Booze, MD;  Location: [facility];  Service: Cardiovascular;  Laterality: N/A;   TONSILLECTOMY      Allergies  Allergies  Allergen Reactions   Hydrocodone Hives and Nausea And Vomiting   Morphine And Related Nausea And Vomiting    If given too much     EKGs/Labs/Other Studies Reviewed:   The following studies were reviewed today:  Transthoracic Echocardiogram: Date: 04/06/2014 Results: - Left ventricle: The cavity size was normal. Systolic function was    normal. The estimated ejection fraction was in the range of 60%    to 65%. Wall motion was normal; there were no regional wall    motion abnormalities. Doppler parameters are consistent with    abnormal left ventricular relaxation (grade 1  diastolic    dysfunction). There was no evidence of elevated ventricular    filling pressure by Doppler parameters.  - Aortic valve: Trileaflet; normal thickness leaflets. There was    mild regurgitation.  - Mitral valve: Structurally normal valve.  - Left atrium: The atrium was normal in size.  - Right ventricle: Systolic function was normal.  - Right atrium: The atrium was normal in size.  - Tricuspid valve: There was no regurgitation.  - Pulmonic valve: Transvalvular velocity was within the normal     range. There was no evidence for stenosis. There was no    regurgitation.  - Pulmonary arteries: Systolic pressure was within the normal    range.  - Inferior vena cava: The vessel was normal in size.  - Pericardium, extracardiac: There was no pericardial effusion.   Cardiac CT: Date:07/12/2019 Results: IMPRESSION: 1. Coronary calcium score of 107. This was 54 percentile for age and sex matched control.   2. Normal coronary origin with right dominance.   3. CAD-RADS 3. Moderate stenosis in the mid RCA and possibly in proximal LAD. Consider symptom-guided anti-ischemic pharmacotherapy as well as risk factor modification per guideline directed care. Additional analysis with CT FFR will be submitted.   4. CT FFR analysis showed significant stenosis in the proximal LAD. A cardiac catheterization is recommended.     Left/Right Heart Catheterizations: Date: 07/21/19 Results: Prox RCA lesion is 25% stenosed. Prox LAD lesion is 75% stenosed. Cross-sectional area of 3.53 mm by intravascular ultrasound. This lesion was significant by CT FFR. A drug-eluting stent was successfully placed using a SYNERGY XD 4.0X16. Stent was optimized with intravascular ultrasound. Post intervention, there is a 0% residual stenosis. The left ventricular systolic function is normal. LV end diastolic pressure is normal. The left ventricular ejection fraction is 55-65% by visual estimate. There is no aortic valve stenosis.   Continue aggressive secondary prevention.  I stressed the importance of dual antiplatelet therapy.  He will likely need to be changed to a higher intensity statin as an outpatient.  We will also change his proton pump inhibitor since we have started clopidogrel.    EKG:  EKG is  ordered today.  The ekg ordered today demonstrates NSR (sinus bradycardia), rate 56 bpm  Recent Labs: 03/08/2022: ALT 17; BUN 13; Creatinine, Ser 0.89; Hemoglobin 13.9; Platelets 253; Potassium 3.9; Sodium  136  Recent Lipid Panel    Component Value Date/Time   CHOL 168 12/09/2019 0816   CHOL 151 07/23/2012 1059   TRIG 127 12/09/2019 0816   TRIG 114 10/29/2012 1048   TRIG 126 07/23/2012 1059   HDL 45 12/09/2019 0816   HDL 44 10/29/2012 1048   HDL 40 07/23/2012 1059   CHOLHDL 3.7 12/09/2019 0816   LDLCALC 100 (H) 12/09/2019 0816   LDLCALC 102 (H) 10/29/2012 1048   LDLCALC 86 07/23/2012 1059    Home Medications   Current Meds  Medication Sig   aspirin EC 81 MG tablet Take 1 tablet (81 mg total) by mouth daily.   carvedilol (COREG) 6.25 MG tablet TAKE 1 TABLET BY MOUTH TWICE A DAY   Evolocumab (REPATHA SURECLICK) [id number] MG/ML SOAJ Inject 1 Pen into the skin every 14 (fourteen) days.   nitroGLYCERIN (NITROSTAT) 0.4 MG SL tablet Place 1 tablet (0.4 mg total) under the tongue every 5 (five) minutes as needed.   pantoprazole (PROTONIX) 40 MG tablet TAKE 1 TABLET (40MG) BY MOUTH DAILY.   rosuvastatin (CRESTOR) 20 MG tablet TAKE 1 TABLET BY MOUTH  EVERY DAY   zolpidem (AMBIEN) 10 MG tablet TAKE 1 TABLET BY MOUTH AT BEDTIME   [DISCONTINUED] lisinopril (ZESTRIL) 40 MG tablet TAKE 1 TABLET BY MOUTH EVERY DAY     Review of Systems      All other systems reviewed and are otherwise negative except as noted above.  Physical Exam    VS:  BP 130/72   Pulse (!) 59   Ht 6' (1.829 m)   Wt 180 lb 6.4 oz (81.8 kg)   SpO2 98%   BMI 24.47 kg/m  , BMI Body mass index is 24.47 kg/m.  Wt Readings from Last 3 Encounters:  04/10/22 180 lb 6.4 oz (81.8 kg)  04/08/21 184 lb (83.5 kg)  09/26/20 179 lb (81.2 kg)     GEN: Well nourished, well developed, in no acute distress. HEENT: normal. Neck: Supple, no JVD, carotid bruits, or masses. Cardiac: RRR, no murmurs, rubs, or gallops. No clubbing, cyanosis, edema.  Radials/PT 2+ and equal bilaterally.  Respiratory:  Respirations regular and unlabored, clear to auscultation bilaterally. GI: Soft, nontender, nondistended. MS: No deformity or  atrophy. Skin: Warm and dry, no rash. Neuro:  Strength and sensation are intact. Psych: Normal affect.  Assessment & Plan    Coronary artery disease s/p PCI 2021 -off DAPT, continue ASA 59m -no chest pain or SOB -continue current medication regimen including: asa 831m coreg 6.2587mID, Repatha 140m18m, lisinopril 40mg48mly, nitro as needed, crestor 20mg 94my  Primary hypertension -BP well controlled today -continue current medications  Hyperlipidemia -LDL 23 -triglycerides 117 -continue Repatha and Crestor       Disposition: Follow up 1 year with MaheshWerner Leanr APP.  Signed, Oron Westrup Elgie Collard 04/10/2022, 8:47 AM [facility]

## 2022-04-10 ENCOUNTER — Encounter: Payer: Self-pay | Admitting: Physician Assistant

## 2022-04-10 ENCOUNTER — Ambulatory Visit: Payer: BC Managed Care – PPO | Attending: Physician Assistant | Admitting: Physician Assistant

## 2022-04-10 VITALS — BP 130/72 | HR 59 | Ht 72.0 in | Wt 180.4 lb

## 2022-04-10 DIAGNOSIS — I10 Essential (primary) hypertension: Secondary | ICD-10-CM

## 2022-04-10 DIAGNOSIS — Z8249 Family history of ischemic heart disease and other diseases of the circulatory system: Secondary | ICD-10-CM

## 2022-04-10 DIAGNOSIS — I25118 Atherosclerotic heart disease of native coronary artery with other forms of angina pectoris: Secondary | ICD-10-CM | POA: Diagnosis not present

## 2022-04-10 DIAGNOSIS — E782 Mixed hyperlipidemia: Secondary | ICD-10-CM

## 2022-04-10 DIAGNOSIS — Z720 Tobacco use: Secondary | ICD-10-CM

## 2022-04-10 DIAGNOSIS — R072 Precordial pain: Secondary | ICD-10-CM

## 2022-04-10 MED ORDER — LISINOPRIL 40 MG PO TABS: 40.0000 mg | ORAL_TABLET | Freq: Every day | ORAL | 3 refills | Status: DC

## 2022-04-10 NOTE — Patient Instructions (Signed)
Medication Instructions:  Your physician recommends that you continue on your current medications as directed. Please refer to the Current Medication list given to you today.  *If you need a refill on your cardiac medications before your next appointment, please call your pharmacy*   Lab Work: None ordered If you have labs (blood work) drawn today and your tests are completely normal, you will receive your results only by: [place] (if you have MyChart) OR A paper copy in the mail If you have any lab test that is abnormal or we need to change your treatment, we will call you to review the results.   Follow-Up: At [facility], you and your health needs are our priority.  As part of our continuing mission to provide you with exceptional heart care, we have created designated Provider Care Teams.  These Care Teams include your primary Cardiologist (physician) and Advanced Practice Providers (APPs -  Physician Assistants and Nurse Practitioners) who all work together to provide you with the care you need, when you need it.   Your next appointment:   1 year(s)  Provider:   Werner Lean, MD

## 2022-05-05 DIAGNOSIS — I10 Essential (primary) hypertension: Secondary | ICD-10-CM | POA: Diagnosis not present

## 2022-05-05 DIAGNOSIS — R7303 Prediabetes: Secondary | ICD-10-CM | POA: Diagnosis not present

## 2022-05-05 DIAGNOSIS — E785 Hyperlipidemia, unspecified: Secondary | ICD-10-CM | POA: Diagnosis not present

## 2022-05-05 DIAGNOSIS — Z0000 Encounter for general adult medical examination without abnormal findings: Secondary | ICD-10-CM | POA: Diagnosis not present

## 2022-05-05 DIAGNOSIS — Z23 Encounter for immunization: Secondary | ICD-10-CM | POA: Diagnosis not present

## 2022-05-05 DIAGNOSIS — Z79899 Other long term (current) drug therapy: Secondary | ICD-10-CM | POA: Diagnosis not present

## 2022-05-18 DIAGNOSIS — Z1212 Encounter for screening for malignant neoplasm of rectum: Secondary | ICD-10-CM | POA: Diagnosis not present

## 2022-05-18 DIAGNOSIS — Z1211 Encounter for screening for malignant neoplasm of colon: Secondary | ICD-10-CM | POA: Diagnosis not present

## 2022-05-26 DIAGNOSIS — L237 Allergic contact dermatitis due to plants, except food: Secondary | ICD-10-CM | POA: Diagnosis not present

## 2022-06-06 DIAGNOSIS — L237 Allergic contact dermatitis due to plants, except food: Secondary | ICD-10-CM | POA: Diagnosis not present

## 2022-06-09 ENCOUNTER — Encounter: Payer: Self-pay | Admitting: *Deleted

## 2022-06-19 ENCOUNTER — Other Ambulatory Visit: Payer: Self-pay | Admitting: Internal Medicine

## 2022-06-19 DIAGNOSIS — E782 Mixed hyperlipidemia: Secondary | ICD-10-CM

## 2022-06-19 DIAGNOSIS — Z8249 Family history of ischemic heart disease and other diseases of the circulatory system: Secondary | ICD-10-CM

## 2022-06-19 DIAGNOSIS — I10 Essential (primary) hypertension: Secondary | ICD-10-CM

## 2022-06-19 DIAGNOSIS — R072 Precordial pain: Secondary | ICD-10-CM

## 2022-08-02 ENCOUNTER — Other Ambulatory Visit (HOSPITAL_COMMUNITY): Payer: Self-pay

## 2022-08-02 ENCOUNTER — Telehealth: Payer: Self-pay

## 2022-08-02 NOTE — Telephone Encounter (Signed)
Pharmacy Patient Advocate Encounter   Received notification from [facility] that prior authorization for REPATHA 140 is required/requested.   PA submitted to [facility] via CoverMyMeds Key or [facility]) confirmation # [id number]  Status is pending

## 2022-08-04 ENCOUNTER — Other Ambulatory Visit (HOSPITAL_COMMUNITY): Payer: Self-pay

## 2022-08-05 NOTE — Telephone Encounter (Signed)
Pharmacy Patient Advocate Encounter  Prior Authorization for REPATHA 140 has been APPROVED by [facility] from 6.9.24 to 6.9.25

## 2022-08-11 ENCOUNTER — Other Ambulatory Visit: Payer: Self-pay | Admitting: Internal Medicine

## 2022-08-15 ENCOUNTER — Other Ambulatory Visit: Payer: Self-pay | Admitting: Internal Medicine

## 2022-09-22 ENCOUNTER — Telehealth: Payer: Self-pay

## 2022-09-22 ENCOUNTER — Encounter: Payer: Self-pay | Admitting: Physician Assistant

## 2022-09-22 ENCOUNTER — Ambulatory Visit: Payer: BC Managed Care – PPO | Admitting: Physician Assistant

## 2022-09-22 VITALS — BP 130/84 | HR 75 | Ht 71.0 in | Wt 178.0 lb

## 2022-09-22 DIAGNOSIS — K219 Gastro-esophageal reflux disease without esophagitis: Secondary | ICD-10-CM

## 2022-09-22 DIAGNOSIS — R0789 Other chest pain: Secondary | ICD-10-CM | POA: Diagnosis not present

## 2022-09-22 MED ORDER — PANTOPRAZOLE SODIUM 40 MG PO TBEC: 40.0000 mg | DELAYED_RELEASE_TABLET | Freq: Two times a day (BID) | ORAL | 5 refills | Status: DC

## 2022-09-22 NOTE — Progress Notes (Signed)
Chief Complaint: Atypical chest pain  HPI:    Russell Gregory is a 52 year old male, known to Dr. Rhea Belton, with a past medical history as listed below including CAD status post stent in 2021 (echo 07/21/2019 with LVEF 55-65%), GERD and multiple others ,who presents to clinic today for a complaint of atypical chest pain.     02/2014 EGD with finding of a 2 cm hiatal hernia, no evidence of Barrett's, acute gastritis which was H. pylori negative.    10/10/2016 office visit with Mike Gip and at that time discussed rectal bleeding and pain.  At that time diagnosed with external hemorrhoid.    03/08/2022 seen in the ED with right lower quadrant pain.  CT of the abdomen pelvis with contrast with bilateral renal cysts and nonobstructing left renal stone otherwise no acute abdominal or pelvic pathology.    03/08/2022 CBC with a WBC of 11.8 otherwise normal, CMP with a glucose of 114 otherwise normal, lipase normal.    04/10/2022 follow-up with cardiology and was doing well.    Today, patient presents to clinic and explains that he feels a pressure in the upper part of his chest often after eating candy/cough drops or at least he knows this aggravates it.  It will then take sometimes 2 to 3 days to completely go away sometimes a little bit longer.  Tells me it can also occur after eating other foods but he knows that when he has candy or cough drops it will definitely come on.  Eventually he is able to release gas.  Sometimes drinking a beer or taking hyoscyamine sulfate seems to help with this sensation.  Denies any trouble when actually swallowing.  He is still on Pantoprazole 40 mg once daily.  He never misses a dose.  No heartburn or reflux symptoms.  Apparently had the same sensation prior to his last EGD years ago and after the EGD was not there anymore.  This has been going on for the past 6 to 8 months.    Reports a negative Cologuard 3 months ago.  I cannot see report of this.    Denies fever, chills, weight  loss, nausea, vomiting or symptoms that awaken him from sleep.  Past Medical History:  Diagnosis Date   CAD (coronary artery disease)    PCI/DES to mLAD   Chest pain at rest 05/05/2014   Dysuria-frequency syndrome    GERD (gastroesophageal reflux disease)    HLD (hyperlipidemia) 07/23/2012   Hyperlipidemia    Hypertension    Inguinal hernia    Insomnia    Kidney calculi    Left hip pain 05/19/2017   Right ankle injury, subsequent encounter 01/28/2017   Right ureteral stone     Past Surgical History:  Procedure Laterality Date   CORONARY STENT INTERVENTION N/A 07/21/2019   Procedure: CORONARY STENT INTERVENTION;  Surgeon: Corky Crafts, MD;  Location: [facility] INVASIVE CV LAB;  Service: Cardiovascular;  Laterality: N/A;   CORONARY ULTRASOUND/IVUS N/A 07/21/2019   Procedure: Intravascular Ultrasound/IVUS;  Surgeon: Corky Crafts, MD;  Location: [facility] INVASIVE CV LAB;  Service: Cardiovascular;  Laterality: N/A;   CYSTOSCOPY WITH RETROGRADE PYELOGRAM, URETEROSCOPY AND STENT PLACEMENT Right 12/06/2013   Procedure: CYSTOSCOPY WITH RETROGRADE PYELOGRAM, URETEROSCOPY AND STONE BASKETRY;  Surgeon: Anner Crete, MD;  Location: [facility];  Service: Urology;  Laterality: Right;   HERNIA REPAIR     left inguinal repair   LEFT HEART CATH AND CORONARY ANGIOGRAPHY N/A 07/21/2019   Procedure:  LEFT HEART CATH AND CORONARY ANGIOGRAPHY;  Surgeon: Corky Crafts, MD;  Location: [facility] INVASIVE CV LAB;  Service: Cardiovascular;  Laterality: N/A;   TONSILLECTOMY      Current Outpatient Medications  Medication Sig Dispense Refill   aspirin EC 81 MG tablet Take 1 tablet (81 mg total) by mouth daily. 90 tablet 3   carvedilol (COREG) 6.25 MG tablet TAKE 1 TABLET BY MOUTH TWICE A DAY 180 tablet 3   Evolocumab (REPATHA SURECLICK) 140 MG/ML SOAJ INJECT 1 PEN INTO THE SKIN EVERY 14 (FOURTEEN) DAYS. 6 mL 3   lisinopril (ZESTRIL) 40 MG tablet Take 1 tablet (40 mg total) by mouth daily. 90  tablet 3   nitroGLYCERIN (NITROSTAT) 0.4 MG SL tablet Place 1 tablet (0.4 mg total) under the tongue every 5 (five) minutes as needed. 25 tablet 2   ondansetron (ZOFRAN) 4 MG tablet Take 1 tablet (4 mg total) by mouth every 6 (six) hours. 12 tablet 0   pantoprazole (PROTONIX) 40 MG tablet TAKE 1 TABLET (40MG ) BY MOUTH DAILY. 90 tablet 2   rosuvastatin (CRESTOR) 20 MG tablet TAKE 1 TABLET BY MOUTH EVERY DAY 90 tablet 3   zolpidem (AMBIEN) 10 MG tablet TAKE 1 TABLET BY MOUTH AT BEDTIME 30 tablet 3   No current facility-administered medications for this visit.    Allergies as of 09/22/2022 - Review Complete 09/22/2022  Allergen Reaction Noted   Hydrocodone Hives and Nausea And Vomiting 01/30/2017   Morphine and codeine Nausea And Vomiting 03/24/2011    Family History  Problem Relation Age of Onset   Heart attack Brother 30       died of massive MI   Heart attack Paternal Grandfather    Heart attack Paternal Uncle    Colon cancer Neg Hx    Colon polyps Neg Hx    Kidney disease Neg Hx    Esophageal cancer Neg Hx    Diabetes Neg Hx    Gallbladder disease Neg Hx    Stomach cancer Neg Hx     Social History   Socioeconomic History   Marital status: Married    Spouse name: Not on file   Number of children: 0   Years of education: Not on file   Highest education level: Not on file  Occupational History   Occupation: Therapist, sports  Tobacco Use   Smoking status: Never   Smokeless tobacco: Never  Vaping Use   Vaping status: Never Used  Substance and Sexual Activity   Alcohol use: No    Alcohol/week: 0.0 standard drinks of alcohol   Drug use: No   Sexual activity: Not on file  Other Topics Concern   Not on file  Social History Narrative   Not on file   Social Determinants of Health   Financial Resource Strain: Not on file  Food Insecurity: Not on file  Transportation Needs: Not on file  Physical Activity: Not on file  Stress: Not on file  Social Connections: Not on file   Intimate Partner Violence: Not on file    Review of Systems:    Constitutional: No weight loss, fever or chills Skin: No rash  Cardiovascular: See HPI Respiratory: No SOB  Gastrointestinal: See HPI and otherwise negative Genitourinary: No dysuria Neurological: No headache, dizziness or syncope Musculoskeletal: No new muscle or joint pain Hematologic: No bleeding Psychiatric: No history of depression or anxiety   Physical Exam:  Vital signs: BP 130/84   Pulse 75   Ht 5\' 11"  (1.803 m)  Wt 178 lb (80.7 kg)   BMI 24.83 kg/m   Constitutional:   Pleasant Caucasian male appears to be in NAD, Well developed, Well nourished, alert and cooperative Head:  Normocephalic and atraumatic. Eyes:   PEERL, EOMI. No icterus. Conjunctiva pink. Ears:  Normal auditory acuity. Neck:  Supple Throat: Oral cavity and pharynx without inflammation, swelling or lesion.  Respiratory: Respirations even and unlabored. Lungs clear to auscultation bilaterally.   No wheezes, crackles, or rhonchi.  Cardiovascular: Normal S1, S2. No MRG. Regular rate and rhythm. No peripheral edema, cyanosis or pallor.  Gastrointestinal:  Soft, nondistended, nontender. No rebound or guarding. Normal bowel sounds. No appreciable masses or hepatomegaly. Rectal:  Not performed.  Msk:  Symmetrical without gross deformities. Without edema, no deformity or joint abnormality.  Neurologic:  Alert and  oriented x4;  grossly normal neurologically.  Skin:   Dry and intact without significant lesions or rashes. Psychiatric: Demonstrates good judgement and reason without abnormal affect or behaviors.  RELEVANT LABS AND IMAGING: CBC    Component Value Date/Time   WBC 11.8 (H) 03/08/2022 0900   RBC 4.31 03/08/2022 0900   HGB 13.9 03/08/2022 0900   HGB 13.9 07/19/2019 1002   HCT 40.3 03/08/2022 0900   HCT 41.0 07/19/2019 1002   PLT 253 03/08/2022 0900   PLT 251 07/19/2019 1002   MCV 93.5 03/08/2022 0900   MCV 96 07/19/2019 1002    MCH 32.3 03/08/2022 0900   MCHC 34.5 03/08/2022 0900   RDW 12.1 03/08/2022 0900   RDW 12.9 07/19/2019 1002    CMP     Component Value Date/Time   NA 136 03/08/2022 0900   NA 138 07/19/2019 1002   K 3.9 03/08/2022 0900   CL 98 03/08/2022 0900   CO2 23 03/08/2022 0900   GLUCOSE 114 (H) 03/08/2022 0900   BUN 13 03/08/2022 0900   BUN 10 07/19/2019 1002   CREATININE 0.89 03/08/2022 0900   CREATININE 1.03 07/23/2012 1059   CALCIUM 10.3 03/08/2022 0900   PROT 7.9 03/08/2022 0900   PROT 7.0 06/30/2013 0902   ALBUMIN 4.7 03/08/2022 0900   ALBUMIN 4.5 06/30/2013 0902   AST 18 03/08/2022 0900   ALT 17 03/08/2022 0900   ALKPHOS 59 03/08/2022 0900   BILITOT 0.6 03/08/2022 0900   GFRNONAA >60 03/08/2022 0900   GFRNONAA 89 07/23/2012 1059   GFRAA 116 07/19/2019 1002   GFRAA >89 07/23/2012 1059    Assessment: 1.  Atypical chest pain: More of a pressure that he describes as a gas pressure and when it is able to be released it seems better but sometimes last 2 to 3 days and is typically brought on by foods, worse with candy over the past 6 months or so, sometimes drinking a beer or taking hyoscyamine sulfate helps, history of CAD status post stenting; consider relation to possible GERD/gastritis/esophagitis versus esophageal spasm versus H. pylori versus SIBO versus cardiac origin 2.  GERD: Chronic for the patient, initial EGD in 2016 with gastritis, maintained on Pantoprazole 40 mg daily; consider relation to above 3.  Screening for colorectal cancer: Patient reports negative Cologuard 3 months ago (I cannot see report), would need repeat Cologuard in 3 years  Plan: 1.  Discussed with patient that he has very atypical symptoms.  We will trial an increase in his Pantoprazole to 40 mg twice daily at first.  He will take this 30 to 60 minutes for breakfast and dinner.  Prescribed #60 with 5 refills.  Discussed that  he will check in with me in 2 weeks.  If this not helping at all then would  recommend an EGD in the [facility] with Dr. Rhea Belton for further evaluation. 2.  If all of the above is normal and unhelpful then may need to consider esophageal manometry versus SIBO testing versus evaluation by the cardiology team 3.  Patient to follow in clinic per recommendations after procedures above.  Did discuss that he should limit his intake of candy as this seems to make his symptoms worse.  Hyacinth Meeker, PA-C [facility] 09/22/2022, 8:32 AM  Cc: No ref. provider found

## 2022-09-22 NOTE — Telephone Encounter (Signed)
Addendum: Reviewed and agree with assessment and management plan. He saw cardiology in Feb 2024 but had no chest pain at that time Given his history I recommend he call for cardiology follow-up as well to exclude CAD as cause of current symptoms. Pyrtle, Carie Caddy, MD

## 2022-09-22 NOTE — Telephone Encounter (Signed)
Called and spoke with patient regarding Dr. Lauro Franklin recommendations. Pt will contact cardiology office to set up follow up appt as recommended. Pt verbalized understanding and had no concerns at the end of the call.

## 2022-09-22 NOTE — Patient Instructions (Signed)
We have sent the following medications to your pharmacy for you to pick up at your convenience:  Pantoprazole - twice a day.  Call back in 2 weeks to let us know how you are doing.   _______________________________________________________  If your blood pressure at your visit was 140/90 or greater, please contact your primary care physician to follow up on this.  _______________________________________________________  If you are age 53 or older, your body mass index should be between 23-30. Your Body mass index is 24.83 kg/m. If this is out of the aforementioned range listed, please consider follow up with your Primary Care Provider.  If you are age 55 or younger, your body mass index should be between 19-25. Your Body mass index is 24.83 kg/m. If this is out of the aformentioned range listed, please consider follow up with your Primary Care Provider.   ________________________________________________________  The [facility] providers would like to encourage you to use [facility] to communicate with providers for non-urgent requests or questions.  Due to long hold times on the telephone, sending your provider a message by [facility] may be a faster and more efficient way to get a response.  Please allow 48 business hours for a response.  Please remember that this is for non-urgent requests.  _______________________________________________________

## 2022-09-22 NOTE — Telephone Encounter (Signed)
-----   Message from Unk Lightning sent at 09/22/2022  1:17 PM EDT ----- Regarding: see cardiology Can you make sure patient follow with cardiology now too. Thanks-JLL ----- Message ----- From: Beverley Fiedler, MD Sent: 09/22/2022  12:57 PM EDT To: Unk Lightning, PA; Sharlene Dory, PA-C     ----- Message ----- From: Unk Lightning, [place] Sent: 09/22/2022   8:56 AM EDT To: Beverley Fiedler, MD

## 2022-09-22 NOTE — Progress Notes (Signed)
Addendum: Reviewed and agree with assessment and management plan. He saw cardiology in Feb 2024 but had no chest pain at that time Given his history I recommend he call for cardiology follow-up as well to exclude CAD as cause of current symptoms. [place] Voytko, Carie Caddy, MD

## 2022-11-03 DIAGNOSIS — Z79899 Other long term (current) drug therapy: Secondary | ICD-10-CM | POA: Diagnosis not present

## 2022-11-03 DIAGNOSIS — K219 Gastro-esophageal reflux disease without esophagitis: Secondary | ICD-10-CM | POA: Diagnosis not present

## 2022-11-03 DIAGNOSIS — E785 Hyperlipidemia, unspecified: Secondary | ICD-10-CM | POA: Diagnosis not present

## 2022-11-03 DIAGNOSIS — I2510 Atherosclerotic heart disease of native coronary artery without angina pectoris: Secondary | ICD-10-CM | POA: Diagnosis not present

## 2022-11-03 DIAGNOSIS — I10 Essential (primary) hypertension: Secondary | ICD-10-CM | POA: Diagnosis not present

## 2022-11-30 DIAGNOSIS — H11432 Conjunctival hyperemia, left eye: Secondary | ICD-10-CM | POA: Diagnosis not present

## 2022-11-30 DIAGNOSIS — H5712 Ocular pain, left eye: Secondary | ICD-10-CM | POA: Diagnosis not present

## 2022-12-08 DIAGNOSIS — T1511XA Foreign body in conjunctival sac, right eye, initial encounter: Secondary | ICD-10-CM | POA: Diagnosis not present

## 2022-12-08 DIAGNOSIS — H10013 Acute follicular conjunctivitis, bilateral: Secondary | ICD-10-CM | POA: Diagnosis not present

## 2022-12-09 ENCOUNTER — Other Ambulatory Visit: Payer: Self-pay | Admitting: Internal Medicine

## 2023-01-16 ENCOUNTER — Other Ambulatory Visit: Payer: Self-pay | Admitting: Physician Assistant

## 2023-03-25 ENCOUNTER — Other Ambulatory Visit: Payer: Self-pay | Admitting: Physician Assistant

## 2023-03-25 DIAGNOSIS — E782 Mixed hyperlipidemia: Secondary | ICD-10-CM

## 2023-03-25 DIAGNOSIS — I10 Essential (primary) hypertension: Secondary | ICD-10-CM

## 2023-03-25 DIAGNOSIS — Z8249 Family history of ischemic heart disease and other diseases of the circulatory system: Secondary | ICD-10-CM

## 2023-03-25 DIAGNOSIS — R072 Precordial pain: Secondary | ICD-10-CM

## 2023-05-22 ENCOUNTER — Ambulatory Visit: Payer: BC Managed Care – PPO | Admitting: Internal Medicine

## 2023-07-15 ENCOUNTER — Other Ambulatory Visit: Payer: Self-pay | Admitting: Physician Assistant

## 2023-07-15 DIAGNOSIS — E782 Mixed hyperlipidemia: Secondary | ICD-10-CM

## 2023-07-15 DIAGNOSIS — Z8249 Family history of ischemic heart disease and other diseases of the circulatory system: Secondary | ICD-10-CM

## 2023-07-15 DIAGNOSIS — I10 Essential (primary) hypertension: Secondary | ICD-10-CM

## 2023-07-15 DIAGNOSIS — R072 Precordial pain: Secondary | ICD-10-CM

## 2023-08-23 ENCOUNTER — Other Ambulatory Visit: Payer: Self-pay | Admitting: Internal Medicine

## 2023-08-23 DIAGNOSIS — I25118 Atherosclerotic heart disease of native coronary artery with other forms of angina pectoris: Secondary | ICD-10-CM

## 2023-08-23 DIAGNOSIS — E782 Mixed hyperlipidemia: Secondary | ICD-10-CM

## 2023-08-30 ENCOUNTER — Other Ambulatory Visit: Payer: Self-pay | Admitting: Internal Medicine

## 2023-08-30 DIAGNOSIS — I10 Essential (primary) hypertension: Secondary | ICD-10-CM

## 2023-08-30 DIAGNOSIS — Z8249 Family history of ischemic heart disease and other diseases of the circulatory system: Secondary | ICD-10-CM

## 2023-08-30 DIAGNOSIS — R072 Precordial pain: Secondary | ICD-10-CM

## 2023-08-30 DIAGNOSIS — E782 Mixed hyperlipidemia: Secondary | ICD-10-CM

## 2023-09-14 ENCOUNTER — Other Ambulatory Visit: Payer: Self-pay | Admitting: Internal Medicine

## 2023-09-14 DIAGNOSIS — Z8249 Family history of ischemic heart disease and other diseases of the circulatory system: Secondary | ICD-10-CM

## 2023-09-14 DIAGNOSIS — E782 Mixed hyperlipidemia: Secondary | ICD-10-CM

## 2023-09-14 DIAGNOSIS — I10 Essential (primary) hypertension: Secondary | ICD-10-CM

## 2023-09-14 DIAGNOSIS — R072 Precordial pain: Secondary | ICD-10-CM

## 2023-09-21 ENCOUNTER — Encounter: Payer: Self-pay | Admitting: Internal Medicine

## 2023-09-21 ENCOUNTER — Ambulatory Visit: Attending: Internal Medicine | Admitting: Internal Medicine

## 2023-09-21 VITALS — BP 115/74 | HR 51 | Ht 71.0 in | Wt 180.0 lb

## 2023-09-21 DIAGNOSIS — Z8249 Family history of ischemic heart disease and other diseases of the circulatory system: Secondary | ICD-10-CM | POA: Diagnosis not present

## 2023-09-21 DIAGNOSIS — I25118 Atherosclerotic heart disease of native coronary artery with other forms of angina pectoris: Secondary | ICD-10-CM | POA: Diagnosis not present

## 2023-09-21 DIAGNOSIS — E782 Mixed hyperlipidemia: Secondary | ICD-10-CM | POA: Diagnosis not present

## 2023-09-21 DIAGNOSIS — I10 Essential (primary) hypertension: Secondary | ICD-10-CM

## 2023-09-21 DIAGNOSIS — R072 Precordial pain: Secondary | ICD-10-CM | POA: Insufficient documentation

## 2023-09-21 MED ORDER — CARVEDILOL 6.25 MG PO TABS: 6.2500 mg | ORAL_TABLET | Freq: Two times a day (BID) | ORAL | 3 refills | Status: AC

## 2023-09-21 NOTE — Progress Notes (Signed)
 Cardiology Office Note:  .    Date:  09/21/2023  ID:  Russell Gregory, DOB Dec 06, 1970, MRN [id number] PCP: Cyrena Gwenn SQUIBB, MD (Inactive)  [facility] Providers Cardiologist:  Stanly DELENA Leavens, MD     CC: Follow up CAD  History of Present Illness: .    Russell Gregory is a 53 y.o. male with coronary artery disease who presents for secondary prevention follow-up.  He has a history of coronary artery disease with a prior percutaneous coronary intervention (PCI) in 2021. He reports no chest pain, breathing issues, or palpitations. No recent episodes of syncope, although he had experienced syncope when getting up to use the bathroom at night in the past. His current medications include aspirin  81 mg, PRN nitroglycerin , rosuvastatin , Repatha , and Coreg  6.25 mg BID. His LDL is currently 18.  He has a history of hypertension and hyperlipidemia. He notes that the medication 'works good'.  He has a history of chewing tobacco use, which he started at age 42. He quit for 30 years but resumed after his brother's death. He experiences Raynaud's phenomenon, describing his hands turning 'completely white and orange and purple'.  He works in a [employer], which he describes as a 'rough job'. He has no history of smoking cigarettes.  Discussed the use of AI scribe software for clinical note transcription with the patient, who gave verbal consent to proceed.   Relevant histories: .  Social  - former [facility] patient - works making batteries - Chewing Tobacco ROS: As per HPI.   Studies Reviewed: .     Cardiac Studies & Procedures   ______________________________________________________________________________________________ CARDIAC CATHETERIZATION  CARDIAC CATHETERIZATION 07/21/2019  Conclusion  Prox RCA lesion is 25% stenosed.  Prox LAD lesion is 75% stenosed. Cross-sectional area of 3.53 mm by intravascular ultrasound. This lesion was significant by CT  FFR.  A drug-eluting stent was successfully placed using a SYNERGY XD 4.0X16. Stent was optimized with intravascular ultrasound.  Post intervention, there is a 0% residual stenosis.  The left ventricular systolic function is normal.  LV end diastolic pressure is normal.  The left ventricular ejection fraction is 55-65% by visual estimate.  There is no aortic valve stenosis.  Continue aggressive secondary prevention.  I stressed the importance of dual antiplatelet therapy.  He will likely need to be changed to a higher intensity statin as an outpatient.  We will also change his proton pump inhibitor since we have started clopidogrel .  Findings Coronary Findings Diagnostic  Dominance: Right  Left Anterior Descending Prox LAD lesion is 75% stenosed. The lesion is eccentric and irregular. Ultrasound (IVUS) was performed. Cross-sectional area: 3.5 mm.  Severe plaque burden was detected. The lesion has a fibro-fatty core. Mid LAD-1 lesion is 25% stenosed. Mid LAD-2 lesion is 25% stenosed.  Right Coronary Artery Prox RCA lesion is 25% stenosed.  Intervention  Prox LAD lesion Stent CATH LAUNCHER 6FR EBU3.5 guide catheter was inserted. Lesion crossed with guidewire using a WIRE ASAHI PROWATER 180CM. Pre-stent angioplasty was not performed. A drug-eluting stent was successfully placed using a SYNERGY XD 4.0X16. Stent strut is well apposed. Post-stent angioplasty was performed using a BALLOON [place] EMERGE MR 4.0X8. Post-Intervention Lesion Assessment The intervention was successful. Pre-interventional TIMI flow is 3. Post-intervention TIMI flow is 3. No complications occurred at this lesion. There is a 0% residual stenosis post intervention.          CT SCANS  CT CORONARY FRACTIONAL FLOW RESERVE DATA PREP 07/13/2019  Narrative EXAM: CT FFR ANALYSIS  CLINICAL DATA:  53 year old male with abnormal CCTA.  FINDINGS: FFRct analysis was performed on the original cardiac CT  angiogram dataset. Diagrammatic representation of the FFRct analysis is provided in a separate PDF document in PACS. This dictation was created using the PDF document and an interactive 3D model of the results. 3D model is not available in the EMR/PACS. Normal FFR range is >0.80.  1. Left Main: 0.95.  2. LAD: Proximal: 0.94, mid: 0.77, distal: 0.76. 3. RI: 0.94. 4. LCX: 0.93. 5. RCA: Proximal: 0.93, 0.86.  IMPRESSION: 1. CT FFR analysis showed significant stenosis in the proximal LAD. A cardiac catheterization is recommended.   Electronically Signed By: Leim Moose On: 07/13/2019 16:02   CT SCANS  CT CORONARY MORPH W/CTA COR W/SCORE 07/12/2019  Addendum 07/12/2019 12:34 PM ADDENDUM REPORT: 07/12/2019 12:31  CLINICAL DATA:  53 year old male with h/o hypertension, HLP, FH of premature CAD and chest pain.  EXAM: Cardiac/Coronary  CTA  TECHNIQUE: The patient was scanned on a [employer].  FINDINGS: A 100 kV prospective scan was triggered in the descending thoracic aorta at 111 HU's. Axial non-contrast 3 mm slices were carried out through the heart. The data set was analyzed on a dedicated work station and scored using the Agatson method. Gantry rotation speed was 250 msecs and collimation was .6 mm. Carvedilol  6.25 mg PO and 0.8 mg of sl NTG was given. The 3D data set was reconstructed in 5% intervals of the 67-82 % of the R-R cycle. Diastolic phases were analyzed on a dedicated work station using MPR, MIP and VRT modes. The patient received 80 cc of contrast.  Aorta:  Normal size.  No calcifications.  No dissection.  Aortic Valve:  Trileaflet.  No calcifications.  Coronary Arteries:  Normal coronary origin.  Right dominance.  RCA is a large dominant artery that gives rise to PDA and PLA. There is mild calcified plaque in the proximal RCA with stenosis 25-49%. Mid RCA has moderate non-calcified plaque with stenosis 50-69%. Distal RCA/PLA/PDA have  no significant plaque.  Left main is a large artery that gives rise to LAD, two small ramuses and LCX arteries. Left main has no plaque.  RI 1,2 are very small branches (2 mm in diameter) with mild calcified plaque.  LAD is a large vessel that gives rise to one diagonal artery. There is mild to moderate mixed, predominantly calcified plaque in the proximal LAD with stenosis 25-49% but possibly 50-69%. Mid and distal LAD has no significant plaque.  D1 is a small branch with no significant plaque.  LCX is a very small non-dominant artery that has no plaque.  Other findings:  Normal pulmonary vein drainage into the left atrium.  Normal left atrial appendage without a thrombus.  Normal size of the pulmonary artery.  IMPRESSION: 1. Coronary calcium  score of 107. This was 15 percentile for age and sex matched control.  2. Normal coronary origin with right dominance.  3. CAD-RADS 3. Moderate stenosis in the mid RCA and possibly in proximal LAD. Consider symptom-guided anti-ischemic pharmacotherapy as well as risk factor modification per guideline directed care. Additional analysis with CT FFR will be submitted.   Electronically Signed By: Leim Moose On: 07/12/2019 12:31  Narrative EXAM: OVER-READ INTERPRETATION  CT CHEST  The following report is an over-read performed by radiologist Dr. Juliene Balder of [facility] on 07/12/2019. This over-read does not include interpretation of cardiac or coronary anatomy or pathology. The coronary  calcium  score/coronary CTA interpretation by the cardiologist is attached.  COMPARISON:  None.  FINDINGS: Vascular: Normal caliber of the visualized thoracic aorta. Pulmonary arteries are not well opacified on this examination.  Mediastinum/Nodes: Visualized mediastinal structures are unremarkable.  Lungs/Pleura: Tiny calcification along the anterior pleural surface of the right upper lobe on sequence 12, image 2 likely  represents a small calcified granuloma. Visualized lungs are clear without large pleural effusions or consolidation. Tiny pleural-based calcification in left lower lobe on sequence 12, image 36.  Upper Abdomen: Images of the upper abdomen are unremarkable.  Musculoskeletal: No acute bone abnormality.  IMPRESSION: Negative over-read examination.  Electronically Signed: By: Juliene Balder M.D. On: 07/12/2019 09:27     ______________________________________________________________________________________________        Physical Exam:    VS:  BP 115/74 (BP Location: Right Arm)   Pulse (!) 51   Ht 5' 11 (1.803 m)   Wt 180 lb (81.6 kg)   SpO2 97%   BMI 25.10 kg/m    Wt Readings from Last 3 Encounters:  09/21/23 180 lb (81.6 kg)  09/22/22 178 lb (80.7 kg)  04/10/22 180 lb 6.4 oz (81.8 kg)    Gen: no distress  Neck: No JVD Cardiac: No Rubs or Gallops, no Murmur, regular bradycardia, +2 radial pulses Respiratory: Clear to auscultation bilaterally, normal effort, normal  respiratory rate GI: Soft, nontender, non-distended  MS: No  edema;  moves all extremities  Integument: Skin feels warm save for his finger tips which are cool and white with no ulcerations Neuro:  At time of evaluation, alert and oriented to person/place/time/situation  Psych: Normal affect, patient feels ok   ASSESSMENT AND PLAN: .     An EKG was ordered for CAD and shows no issues  Coronary artery disease - Fhx of CAD History of coronary artery disease with prior PCI in 2021. Currently asymptomatic with no chest pain, dyspnea, or syncope. EKG shows improvement or inferior TW since 2021. Continues on aspirin  and PRN nitroglycerin  with no recent use. - Continue aspirin  81 mg daily, BB statin PCSK9i and current therapy - Continue PRN nitroglycerin  (none needed) - Report any recurrence of chest discomfort or syncope immediately  Hyperlipidemia Hyperlipidemia well-controlled with current regimen. LDL is  at 18, below target levels. Continues on rosuvastatin  and Repatha . - Continue rosuvastatin  - Continue Repatha  - Refills for two years  Hypertension Hypertension is well-controlled. Blood pressure is stable with current regimen.  Asymptomatic bradycardia Asymptomatic bradycardia likely physiologic due to Coreg  (carvedilol ) 6.25 mg BID. No symptoms reported. - Continue Coreg  6.25 mg BID  Raynaud's phenomenon Raynaud's phenomenon likely exacerbated by chewing tobacco use. Symptoms include hands turning white, orange, and purple. History of successful tobacco cessation. - Advise cessation of chewing tobacco to reduce symptoms - Encourage gradual reduction in tobacco use, leveraging past successful cessation experience  Two years with me unless symptoms  Stanly Leavens, MD FASE [facility] Cardiologist [facility]  [address] [place], [place] [address] [phone]  9:14 AM

## 2023-09-21 NOTE — Patient Instructions (Signed)
 Medication Instructions:  Your physician recommends that you continue on your current medications as directed. Please refer to the Current Medication list given to you today.  *If you need a refill on your cardiac medications before your next appointment, please call your pharmacy*  Lab Work: NONE  If you have labs (blood work) drawn today and your tests are completely normal, you will receive your results only by: MyChart Message (if you have MyChart) OR A paper copy in the mail If you have any lab test that is abnormal or we need to change your treatment, we will call you to review the results.  Testing/Procedures: NONE  Follow-Up: At [facility], you and your health needs are our priority.  As part of our continuing mission to provide you with exceptional heart care, our providers are all part of one team.  This team includes your primary Cardiologist (physician) and Advanced Practice Providers or APPs (Physician Assistants and Nurse Practitioners) who all work together to provide you with the care you need, when you need it.  Your next appointment:   2 year(s)  Provider:   Stanly DELENA Leavens, MD

## 2023-10-29 ENCOUNTER — Other Ambulatory Visit: Payer: Self-pay | Admitting: Internal Medicine

## 2023-10-29 NOTE — Telephone Encounter (Signed)
 Pt's pharmacy is requesting a refill on medication pantoprazole . There are two different directions on this medication. Please clarify how pt is suppose to be taking this medication.

## 2023-10-30 ENCOUNTER — Other Ambulatory Visit: Payer: Self-pay | Admitting: Physician Assistant

## 2023-12-09 ENCOUNTER — Other Ambulatory Visit: Payer: Self-pay | Admitting: Internal Medicine

## 2023-12-09 DIAGNOSIS — E782 Mixed hyperlipidemia: Secondary | ICD-10-CM

## 2023-12-09 DIAGNOSIS — I10 Essential (primary) hypertension: Secondary | ICD-10-CM

## 2023-12-09 DIAGNOSIS — R072 Precordial pain: Secondary | ICD-10-CM

## 2023-12-09 DIAGNOSIS — Z8249 Family history of ischemic heart disease and other diseases of the circulatory system: Secondary | ICD-10-CM

## 2023-12-28 ENCOUNTER — Other Ambulatory Visit: Payer: Self-pay | Admitting: Internal Medicine

## 2024-01-31 ENCOUNTER — Other Ambulatory Visit: Payer: Self-pay | Admitting: Physician Assistant

## 2024-02-19 ENCOUNTER — Emergency Department (HOSPITAL_COMMUNITY)

## 2024-02-19 ENCOUNTER — Emergency Department (HOSPITAL_COMMUNITY)
Admission: EM | Admit: 2024-02-19 | Discharge: 2024-02-20 | Disposition: A | Discharge status: Home / Self Care | Attending: Emergency Medicine | Admitting: Emergency Medicine

## 2024-02-19 ENCOUNTER — Other Ambulatory Visit: Payer: Self-pay

## 2024-02-19 ENCOUNTER — Encounter (HOSPITAL_COMMUNITY): Payer: Self-pay | Admitting: Emergency Medicine

## 2024-02-19 DIAGNOSIS — Z7982 Long term (current) use of aspirin: Secondary | ICD-10-CM | POA: Diagnosis not present

## 2024-02-19 DIAGNOSIS — I10 Essential (primary) hypertension: Secondary | ICD-10-CM | POA: Insufficient documentation

## 2024-02-19 DIAGNOSIS — R112 Nausea with vomiting, unspecified: Secondary | ICD-10-CM | POA: Insufficient documentation

## 2024-02-19 DIAGNOSIS — R072 Precordial pain: Secondary | ICD-10-CM | POA: Diagnosis present

## 2024-02-19 DIAGNOSIS — I2510 Atherosclerotic heart disease of native coronary artery without angina pectoris: Secondary | ICD-10-CM | POA: Insufficient documentation

## 2024-02-19 DIAGNOSIS — Z79899 Other long term (current) drug therapy: Secondary | ICD-10-CM | POA: Diagnosis not present

## 2024-02-19 LAB — BASIC METABOLIC PANEL WITH GFR
Anion gap: 8 (ref 5–15)
BUN: 10 mg/dL (ref 6–20)
CO2: 26 mmol/L (ref 22–32)
Calcium: 9.4 mg/dL (ref 8.9–10.3)
Chloride: 104 mmol/L (ref 98–111)
Creatinine, Ser: 0.79 mg/dL (ref 0.61–1.24)
GFR, Estimated: >60 mL/min
Glucose, Bld: 119 mg/dL — ABNORMAL HIGH (ref 70–99)
Potassium: 4.6 mmol/L (ref 3.5–5.1)
Sodium: 137 mmol/L (ref 135–145)

## 2024-02-19 LAB — CBC
HCT: 42.6 % (ref 39.0–52.0)
Hemoglobin: 14.6 g/dL (ref 13.0–17.0)
MCH: 32.5 pg (ref 26.0–34.0)
MCHC: 34.3 g/dL (ref 30.0–36.0)
MCV: 94.9 fL (ref 80.0–100.0)
Platelets: 232 K/uL (ref 150–400)
RBC: 4.49 MIL/uL (ref 4.22–5.81)
RDW: 12.1 % (ref 11.5–15.5)
WBC: 7.5 K/uL (ref 4.0–10.5)
nRBC: 0 % (ref 0.0–0.2)

## 2024-02-19 LAB — TROPONIN T, HIGH SENSITIVITY: Troponin T High Sensitivity: <15 ng/L (ref 0–19)

## 2024-02-19 NOTE — ED Triage Notes (Signed)
 Pt reports left sided chest pain x 2 days. Also c/o n/v. Hx stents.

## 2024-02-20 LAB — TROPONIN T, HIGH SENSITIVITY: Troponin T High Sensitivity: <15 ng/L (ref 0–19)

## 2024-02-20 NOTE — Discharge Instructions (Signed)
 You are seen today for chest pain.  Your workup today is reassuring.  It is very important that you follow-up with cardiology.  If you have recurrence of symptoms or any new or worsening symptoms, you should be reevaluated immediately.  Call 911 or visit your closest emergency department.

## 2024-02-29 NOTE — ED Provider Notes (Signed)
 " [place] EMERGENCY DEPARTMENT AT [facility] Provider Note   CSN: [id number] Arrival date & time: 02/19/24  2153     Patient presents with: Chest Pain   Russell Gregory is a 54 y.o. male.   HPI     This is a 54 year old male with a history of heart disease who presents with concern for chest pain.  Patient reports he had some left-sided chest discomfort over the last 2 days.  Associated nausea and vomiting.  No exertional symptoms.  Does have a history of stent placement and this concerned him.  No shortness of breath.  No recent fevers or cough.  Not currently having any pain.  Prior to Admission medications  Medication Sig Start Date End Date Taking? Authorizing Provider  aspirin  EC 81 MG tablet Take 1 tablet (81 mg total) by mouth daily. 06/07/19   Maranda Leim DEL, MD  carvedilol  (COREG ) 6.25 MG tablet Take 1 tablet (6.25 mg total) by mouth 2 (two) times daily. 09/21/23   Chandrasekhar, Stanly LABOR, MD  Evolocumab  (REPATHA  SURECLICK) 140 MG/ML SOAJ INJECT 1 PEN INTO THE SKIN EVERY 14 (FOURTEEN) DAYS. 08/24/23   Santo Stanly LABOR, MD  lisinopril  (ZESTRIL ) 40 MG tablet Take 1 tablet (40 mg total) by mouth daily. 12/14/23   Chandrasekhar, Stanly A, MD  nitroGLYCERIN  (NITROSTAT ) 0.4 MG SL tablet Place 1 tablet (0.4 mg total) under the tongue every 5 (five) minutes as needed. 07/21/19   Henry Manuelita NOVAK, NP  ondansetron  (ZOFRAN ) 4 MG tablet Take 1 tablet (4 mg total) by mouth every 6 (six) hours. Patient not taking: Reported on 09/21/2023 03/08/22   Suellen Cantor A, PA-C  pantoprazole  (PROTONIX ) 40 MG tablet Take 1 tablet (40 mg total) by mouth daily. Call our office at [phone] to schedule appointment for additional refills 02/01/24   Beather Delon Gibson, [place]  rosuvastatin  (CRESTOR ) 20 MG tablet TAKE 1 TABLET BY MOUTH EVERY DAY 12/29/23   Santo Stanly A, MD  zolpidem  (AMBIEN ) 10 MG tablet TAKE 1 TABLET BY MOUTH AT BEDTIME Patient not taking: Reported on  09/21/2023 06/30/13   Cyrena Gwenn SQUIBB, MD    Allergies: Hydrocodone  and Morphine and codeine    Review of Systems  Constitutional:  Negative for fever.  Respiratory:  Negative for shortness of breath.   Cardiovascular:  Positive for chest pain.  Gastrointestinal:  Positive for nausea and vomiting.  All other systems reviewed and are negative.   Updated Vital Signs BP 130/86   Pulse 60   Temp 97.9 F (36.6 C) (Oral)   Resp 18   SpO2 100%   Physical Exam Vitals and nursing note reviewed.  Constitutional:      Appearance: He is well-developed. He is not ill-appearing.  HENT:     Head: Normocephalic and atraumatic.  Eyes:     Pupils: Pupils are equal, round, and reactive to light.  Cardiovascular:     Rate and Rhythm: Normal rate and regular rhythm.     Heart sounds: Normal heart sounds. No murmur heard. Pulmonary:     Effort: Pulmonary effort is normal. No respiratory distress.     Breath sounds: Normal breath sounds. No wheezing.  Abdominal:     General: Bowel sounds are normal.     Palpations: Abdomen is soft.     Tenderness: There is no abdominal tenderness. There is no rebound.  Musculoskeletal:     Cervical back: Neck supple.  Lymphadenopathy:     Cervical: No cervical adenopathy.  Skin:  General: Skin is warm and dry.  Neurological:     Mental Status: He is alert and oriented to person, place, and time.  Psychiatric:        Mood and Affect: Mood normal.     (all labs ordered are listed, but only abnormal results are displayed) Labs Reviewed  BASIC METABOLIC PANEL WITH GFR - Abnormal; Notable for the following components:      Result Value   Glucose, Bld 119 (*)    All other components within normal limits  CBC  TROPONIN T, HIGH SENSITIVITY  TROPONIN T, HIGH SENSITIVITY    EKG: EKG Interpretation Date/Time:  Friday February 19 2024 22:07:26 EST Ventricular Rate:  61 PR Interval:  117 QRS Duration:  91 QT Interval:  403 QTC Calculation: 406 R  Axis:   75  Text Interpretation: Sinus rhythm Borderline short PR interval Confirmed by Bari Pfeiffer ([id number]) on 02/20/2024 2:51:47 AM  Radiology: No results found.   Procedures   Medications Ordered in the ED - No data to display                                  Medical Decision Making Amount and/or Complexity of Data Reviewed Labs: ordered. Radiology: ordered.   This patient presents to the ED for concern of chest pain, this involves an extensive number of treatment options, and is a complaint that carries with it a high risk of complications and morbidity.  I considered the following differential and admission for this acute, potentially life threatening condition.  The differential diagnosis includes ACS, PE, pneumothorax, pneumonia, chest wall  MDM:    This is a 54 year old male who presents with chest pain.  Ongoing for the last 2 days.  Associated nausea and vomiting.  Nontoxic and vital signs are reassuring.  Very atypical for ACS.  EKG shows no evidence of acute ischemia or arrhythmia.  Troponin x 2 negative.  Chest x-ray without pneumothorax or pneumonia.  Basic lab works reassuring.  Low suspicion for PE.  Overall reassuring workup.  Will have him follow-up with cardiology.  He is currently chest pain-free.  (Labs, imaging, consults)  Labs: I Ordered, and personally interpreted labs.  The pertinent results include: CBC, BMP, troponin x 2  Imaging Studies ordered: I ordered imaging studies including chest x-ray I independently visualized and interpreted imaging. I agree with the radiologist interpretation  Additional history obtained from chart review.  External records from outside source obtained and reviewed including prior evaluations  Cardiac Monitoring: The patient was not maintained on a cardiac monitor.  If on the cardiac monitor, I personally viewed and interpreted the cardiac monitored which showed an underlying rhythm of: N/A  Reevaluation: After the  interventions noted above, I reevaluated the patient and found that they have :resolved  Social Determinants of Health:  lives independently  Disposition: Discharge  Co morbidities that complicate the patient evaluation  Past Medical History:  Diagnosis Date   CAD (coronary artery disease)    PCI/DES to mLAD   Chest pain at rest 05/05/2014   Dysuria-frequency syndrome    GERD (gastroesophageal reflux disease)    HLD (hyperlipidemia) 07/23/2012   Hyperlipidemia    Hypertension    Inguinal hernia    Insomnia    Kidney calculi    Left hip pain 05/19/2017   Right ankle injury, subsequent encounter 01/28/2017   Right ureteral stone      Medicines No  orders of the defined types were placed in this encounter.   I have reviewed the patients home medicines and have made adjustments as needed  Problem List / ED Course: Problem List Items Addressed This Visit       Other   Precordial pain - Primary   Relevant Orders   Ambulatory referral to Cardiology             Final diagnoses:  Precordial pain    ED Discharge Orders          Ordered    Ambulatory referral to Cardiology        02/20/24 0344               Bari Charmaine FALCON, MD 02/29/24 0142  "

## 2024-03-06 ENCOUNTER — Other Ambulatory Visit: Payer: Self-pay | Admitting: Physician Assistant

## 2024-03-11 ENCOUNTER — Other Ambulatory Visit: Payer: Self-pay | Admitting: Physician Assistant

## 2024-03-17 ENCOUNTER — Encounter: Payer: Self-pay | Admitting: Internal Medicine

## 2024-03-17 NOTE — Telephone Encounter (Signed)
 error
